# Patient Record
Sex: Male | Born: 1965
Health system: Southern US, Community
[De-identification: ages and names within clinical notes are randomized; demographics above are authoritative.]

## PROBLEM LIST (undated history)

## (undated) ENCOUNTER — Ambulatory Visit: Disposition: A | Discharge status: Home / Self Care | Payer: BC Managed Care – PPO

## (undated) DIAGNOSIS — N200 Calculus of kidney: Secondary | ICD-10-CM

---

## 2006-07-29 HISTORY — PX: OTHER SURGICAL HISTORY: SHX169

## 2012-01-08 ENCOUNTER — Emergency Department (HOSPITAL_COMMUNITY)
Admission: EM | Admit: 2012-01-08 | Discharge: 2012-01-08 | Disposition: A | Discharge status: Home / Self Care | Payer: 59 | Attending: Emergency Medicine | Admitting: Emergency Medicine

## 2012-01-08 ENCOUNTER — Encounter (HOSPITAL_COMMUNITY): Payer: Self-pay | Admitting: Neurology

## 2012-01-08 ENCOUNTER — Emergency Department (HOSPITAL_COMMUNITY): Payer: 59

## 2012-01-08 DIAGNOSIS — N200 Calculus of kidney: Secondary | ICD-10-CM

## 2012-01-08 DIAGNOSIS — R109 Unspecified abdominal pain: Secondary | ICD-10-CM | POA: Insufficient documentation

## 2012-01-08 DIAGNOSIS — R6883 Chills (without fever): Secondary | ICD-10-CM | POA: Insufficient documentation

## 2012-01-08 DIAGNOSIS — N201 Calculus of ureter: Secondary | ICD-10-CM | POA: Insufficient documentation

## 2012-01-08 DIAGNOSIS — R10813 Right lower quadrant abdominal tenderness: Secondary | ICD-10-CM | POA: Insufficient documentation

## 2012-01-08 LAB — DIFFERENTIAL
Basophils Relative: 0 % (ref 0–1)
Lymphs Abs: 1.4 10*3/uL (ref 0.7–4.0)
Monocytes Absolute: 0.5 10*3/uL (ref 0.1–1.0)
Monocytes Relative: 6 % (ref 3–12)
Neutro Abs: 6.3 10*3/uL (ref 1.7–7.7)
Neutrophils Relative %: 75 % (ref 43–77)

## 2012-01-08 LAB — COMPREHENSIVE METABOLIC PANEL
Alkaline Phosphatase: 57 U/L (ref 39–117)
BUN: 24 mg/dL — ABNORMAL HIGH (ref 6–23)
CO2: 24 mEq/L (ref 19–32)
Chloride: 103 mEq/L (ref 96–112)
Creatinine, Ser: 1.38 mg/dL — ABNORMAL HIGH (ref 0.50–1.35)
GFR calc Af Amer: 71 mL/min — ABNORMAL LOW (ref >90)
GFR calc non Af Amer: 61 mL/min — ABNORMAL LOW (ref >90)
Glucose, Bld: 130 mg/dL — ABNORMAL HIGH (ref 70–99)
Potassium: 3.8 mEq/L (ref 3.5–5.1)
Total Bilirubin: 1.9 mg/dL — ABNORMAL HIGH (ref 0.3–1.2)

## 2012-01-08 LAB — CBC
HCT: 42.4 % (ref 39.0–52.0)
Hemoglobin: 15.4 g/dL (ref 13.0–17.0)
MCHC: 36.3 g/dL — ABNORMAL HIGH (ref 30.0–36.0)
RBC: 4.94 MIL/uL (ref 4.22–5.81)

## 2012-01-08 LAB — LIPASE, BLOOD: Lipase: 27 U/L (ref 11–59)

## 2012-01-08 MED ORDER — IOHEXOL 300 MG/ML  SOLN
20.0000 mL | INTRAMUSCULAR | Status: AC
  Administered 2012-01-08: 20 mL via ORAL

## 2012-01-08 MED ORDER — ONDANSETRON HCL 4 MG/2ML IJ SOLN
4.0000 mg | Freq: Once | INTRAMUSCULAR | Status: AC
  Administered 2012-01-08: 4 mg via INTRAVENOUS
  Filled 2012-01-08: qty 2

## 2012-01-08 MED ORDER — TAMSULOSIN HCL 0.4 MG PO CAPS: 0.4000 mg | ORAL_CAPSULE | Freq: Every day | ORAL | Status: DC

## 2012-01-08 MED ORDER — ONDANSETRON HCL 4 MG PO TABS: 4.0000 mg | ORAL_TABLET | Freq: Four times a day (QID) | ORAL | Status: AC

## 2012-01-08 MED ORDER — KETOROLAC TROMETHAMINE 30 MG/ML IJ SOLN
30.0000 mg | Freq: Once | INTRAMUSCULAR | Status: AC
  Administered 2012-01-08: 30 mg via INTRAVENOUS
  Filled 2012-01-08: qty 1

## 2012-01-08 MED ORDER — HYDROMORPHONE HCL PF 1 MG/ML IJ SOLN
1.0000 mg | Freq: Once | INTRAMUSCULAR | Status: AC
  Administered 2012-01-08: 1 mg via INTRAVENOUS
  Filled 2012-01-08: qty 1

## 2012-01-08 MED ORDER — OXYCODONE-ACETAMINOPHEN 5-325 MG PO TABS: ORAL_TABLET | ORAL | Status: DC

## 2012-01-08 MED ORDER — SODIUM CHLORIDE 0.9 % IV SOLN
Freq: Once | INTRAVENOUS | Status: AC
  Administered 2012-01-08: 09:00:00 via INTRAVENOUS

## 2012-01-08 NOTE — ED Provider Notes (Cosign Needed)
History     CSN: [id number]  Arrival date & time 01/08/12  0744   First MD Initiated Contact with Patient 01/08/12 0753      Chief Complaint  Patient presents with  . Abdominal Pain    (Consider location/radiation/quality/duration/timing/severity/associated sxs/prior treatment) Patient is a 46 y.o. male presenting with abdominal pain. The history is provided by the patient (the pt complains of right lower quadrant pain today.  the pain is severe and began quickly). No language interpreter was used.  Abdominal Pain The primary symptoms of the illness include abdominal pain. The primary symptoms of the illness do not include fatigue or diarrhea. The current episode started 3 to 5 hours ago. The onset of the illness was sudden. The problem has not changed since onset. The illness is associated with awakening from sleep. The patient states that she believes she is currently not pregnant. The patient has not had a change in bowel habit. Additional symptoms associated with the illness include chills. Symptoms associated with the illness do not include hematuria, frequency or back pain. Significant associated medical issues do not include PUD.    History reviewed. No pertinent past medical history.  History reviewed. No pertinent past surgical history.  No family history on file.  History  Substance Use Topics  . Smoking status: Not on file  . Smokeless tobacco: Not on file  . Alcohol Use: Not on file      Review of Systems  Constitutional: Positive for chills. Negative for fatigue.  HENT: Negative for congestion, sinus pressure and ear discharge.   Eyes: Negative for discharge.  Respiratory: Negative for cough.   Cardiovascular: Negative for chest pain.  Gastrointestinal: Positive for abdominal pain. Negative for diarrhea.  Genitourinary: Negative for frequency and hematuria.  Musculoskeletal: Negative for back pain.  Skin: Negative for rash.  Neurological: Negative for  seizures and headaches.  Hematological: Negative.   Psychiatric/Behavioral: Negative for hallucinations.    Allergies  Review of patient's allergies indicates no known allergies.  Home Medications   Current Outpatient Rx  Name Route Sig Dispense Refill  . ONDANSETRON HCL 4 MG PO TABS Oral Take 1 tablet (4 mg total) by mouth every 6 (six) hours. 12 tablet 0  . OXYCODONE-ACETAMINOPHEN 5-325 MG PO TABS  Take one tablet every 4-6 hours for pain 25 tablet 0  . TAMSULOSIN HCL 0.4 MG PO CAPS Oral Take 1 capsule (0.4 mg total) by mouth daily. 5 capsule 0    BP 137/81  Pulse 67  Temp 97 F (36.1 C) (Oral)  Resp 17  SpO2 100%  Physical Exam  Constitutional: He is oriented to person, place, and time. He appears well-developed.  HENT:  Head: Normocephalic and atraumatic.  Eyes: Conjunctivae and EOM are normal. No scleral icterus.  Neck: Neck supple. No thyromegaly present.  Cardiovascular: Normal rate and regular rhythm.  Exam reveals no gallop and no friction rub.   No murmur heard. Pulmonary/Chest: No stridor. He has no wheezes. He has no rales. He exhibits no tenderness.  Abdominal: He exhibits no distension. There is tenderness. There is no rebound.       Tender rlq  Musculoskeletal: Normal range of motion. He exhibits no edema.  Lymphadenopathy:    He has no cervical adenopathy.  Neurological: He is oriented to person, place, and time. Coordination normal.  Skin: No rash noted. No erythema.  Psychiatric: He has a normal mood and affect. His behavior is normal.    ED Course  Procedures (including critical  care time)  Labs Reviewed  CBC - Abnormal; Notable for the following:    MCHC 36.3 (*)     All other components within normal limits  COMPREHENSIVE METABOLIC PANEL - Abnormal; Notable for the following:    Glucose, Bld 130 (*)     BUN 24 (*)     Creatinine, Ser 1.38 (*)     Total Bilirubin 1.9 (*)     GFR calc non Af Amer 61 (*)     GFR calc Af Amer 71 (*)     All  other components within normal limits  DIFFERENTIAL  LIPASE, BLOOD  URINALYSIS, ROUTINE W REFLEX MICROSCOPIC   Ct Abdomen Pelvis Wo Contrast  01/08/2012  *RADIOLOGY REPORT*  Clinical Data: Abdominal pain.  Nausea vomiting.  CT ABDOMEN AND PELVIS WITHOUT CONTRAST  Technique:  Multidetector CT imaging of the abdomen and pelvis was performed following the standard protocol without intravenous contrast.  Comparison: None.  Findings: The liver is diffusely fatty infiltrated and measures 18 cm in cranial caudal length.  Spleen is unremarkable.  The stomach, duodenum, pancreas, gallbladder, and adrenal glands are unremarkable.  Left kidney is normal in appearance.  There is mild fullness of the right intrarenal collecting system and perinephric edema/fluid is evident.  Although the right ureter is not substantially dilated, a 1 x 1 x 3 mm stone is identified in the distal right ureter about a centimeter proximal to the UVJ.  No evidence for left ureteral or bladder stones.  No abdominal aortic aneurysm.  There is no free fluid or lymphadenopathy in the abdomen.  Imaging through the pelvis shows no free intraperitoneal fluid.  No pelvic sidewall lymphadenopathy.  The terminal ileum is normal. The appendix is normal.  Bone windows reveal no worrisome lytic or sclerotic osseous lesions.  IMPRESSION: 1 x 1 x 3 mm distal right ureteral stone with prominent right perinephric fluid/edema evident.  Calyceal rupture is a possibility.  Original Report Authenticated By: ERIC A. MANSELL, M.D.     1. Kidney stone       MDM          Benny Lennert, MD 01/08/12 1302

## 2012-01-08 NOTE — ED Notes (Signed)
Patient sitting on side of bed trying to urinate.

## 2012-01-08 NOTE — ED Notes (Signed)
Pt 02 sats decreased to 85% on RA after dilaudid administration. Pt placed on o2 at 2l and sats increased to 100%. Remains a&ox4. edp aware

## 2012-01-08 NOTE — Discharge Instructions (Signed)
Strain your urine and follow up with DR. Borden next week.

## 2012-01-08 NOTE — ED Notes (Signed)
Patient resting and states he is still having pain in his right side. Patient denies nausea at this time. Family at bedside.

## 2012-01-08 NOTE — ED Notes (Signed)
Per ems- Pt comes from work c/o abd pain "burning" since 5 am this morning. Pt ate breakfast with no change, got to work had n/v x 3 reported. Abdm pain tenderness in RLQ no rebound tenderness. Positional movement causes pain, pain is constant. 142/90, RR 14, HR 72 SR. A x 4.

## 2012-01-08 NOTE — ED Notes (Signed)
Patient returned from CT. Family at bedside

## 2012-01-08 NOTE — ED Notes (Signed)
Patient transported to CT 

## 2012-01-13 ENCOUNTER — Other Ambulatory Visit: Payer: Self-pay | Admitting: Urology

## 2012-01-13 ENCOUNTER — Encounter (HOSPITAL_BASED_OUTPATIENT_CLINIC_OR_DEPARTMENT_OTHER): Payer: Self-pay | Admitting: *Deleted

## 2012-01-13 DIAGNOSIS — N200 Calculus of kidney: Secondary | ICD-10-CM

## 2012-01-13 HISTORY — DX: Calculus of kidney: N20.0

## 2012-01-13 NOTE — H&P (Signed)
Problems Problems  1. Distal Ureteral Stone On The Right 592.1  History of Present Illness     Ernest Bradshaw is a 46 yo WM who is sent in consultation from the ER for a right distal stone.  He had the onset of pain last week.  The pain was severe and was associated with some nausea.  He has had some penile pain but no hematuria or voiding complaints.  The stone was 1x1x30mm just above the UVJ on the right.  He had a similar episode about 3 months ago but it was transient.  He saw nothing pass.  He is still having some pain in the RLQ with a burning sensation.  He has had no other GU history.  He didn't have a UA at the hospital and wasn't able to get a specimen today.  His Cr. was 1.38 at the hospital.   Past Medical History Problems  1. History of  Esophageal Reflux 530.81 2. History of  Nephrolithiasis V13.01  Surgical History Problems  1. History of  Arm Incision Left  Current Meds 1. OxyCODONE HCl TABS; Therapy: (Recorded:17Jun2013) to  Allergies Medication  1. No Known Drug Allergies  Family History  Negative   Social History Problems    Caffeine Use 1 tea per day   Marital History - Single   Never A Smoker   Occupation: Firefighter    History of  Alcohol Use  Review of Systems  Genitourinary: urinary frequency, dysuria, nocturia, difficulty starting the urinary stream, weak urinary stream, penile pain and initiating urination requires straining (which has been present prior to the pain).  Gastrointestinal: nausea and abdominal pain.  Constitutional: night sweats and feeling tired (fatigue).  Respiratory: shortness of breath and cough.    Vitals Vital Signs [Data Includes: Last 1 Day]  17Jun2013 12:35PM  BMI Calculated: 25.67 BSA Calculated: 2.17 Height: 6 ft 2 in Weight: 200 lb  Blood Pressure: 153 / 90 Temperature: 98.8 F Heart Rate: 62  Physical Exam Constitutional: Well nourished and well developed . No acute distress.  ENT:. The ears and  nose are normal in appearance.  Neck: The appearance of the neck is normal and no neck mass is present.  Pulmonary: No respiratory distress and normal respiratory rhythm and effort.  Cardiovascular: Heart rate and rhythm are normal . No peripheral edema.  Abdomen: No masses are palpated. Moderate tenderness in the RLQ is present. mild right CVA tenderness, but no left CVA tenderness. No hernias are palpable. No hepatosplenomegaly noted.  Lymphatics: The supraclavicular nodes are not enlarged or tender.  Skin: Normal skin turgor, no visible rash and no visible skin lesions.  Neuro/Psych:. Mood and affect are appropriate.    Results/Data  The following images/tracing/specimen were independently visualized:  I have reviewed his CT films and report. KUB today shows retained GI contrast but there is a 2.40mm calcification in the right pelvis consistent with the stone seen on CT.    Assessment Assessed  1. Distal Ureteral Stone On The Right 592.1   He has a right distal stone with persistent symptoms.   Plan Distal Ureteral Stone On The Right (592.1)  1. Oxycodone-Acetaminophen 5-325 MG Oral Tablet; take 1 or 2 tablets q 4-6 hours prn pain;  Therapy: 17Jun2013 to (Last Rx:17Jun2013) 2. KUB  Done: 17Jun2013 12:00AM 3. Follow-up Schedule Surgery Office  Follow-up  Requested for: 17Jun2013 Health Maintenance (V70.0)  4. UA With REFLEX  Done: 17Jun2013   I discussed further medical therapy vs ureteroscopy.  I don't think we could do this with [facility]SWL. He wants to get something done.  I will set him up for right ureteroscopy and have reviewed the risks in detail including but not limited to, bleeding, infection, ureteral injury, need for stent or additional procedures, thrombotic events and anesthetic complications. I have refilled his pain med.

## 2012-01-13 NOTE — Progress Notes (Signed)
To [facility] at 1130.Kub,Hg on arrival. Npo after MN-may have clear liquids only until 0700,then Npo-take pain meds as needed w/clear liquids.

## 2012-01-14 ENCOUNTER — Ambulatory Visit (HOSPITAL_COMMUNITY): Payer: 59

## 2012-01-14 ENCOUNTER — Encounter (HOSPITAL_BASED_OUTPATIENT_CLINIC_OR_DEPARTMENT_OTHER): Payer: Self-pay | Admitting: *Deleted

## 2012-01-14 ENCOUNTER — Encounter (HOSPITAL_BASED_OUTPATIENT_CLINIC_OR_DEPARTMENT_OTHER): Payer: Self-pay | Admitting: Anesthesiology

## 2012-01-14 ENCOUNTER — Ambulatory Visit (HOSPITAL_BASED_OUTPATIENT_CLINIC_OR_DEPARTMENT_OTHER): Payer: 59 | Admitting: Anesthesiology

## 2012-01-14 ENCOUNTER — Encounter (HOSPITAL_BASED_OUTPATIENT_CLINIC_OR_DEPARTMENT_OTHER)
Admission: RE | Disposition: A | Discharge status: Home / Self Care | Payer: Self-pay | Source: Ambulatory Visit | Attending: Urology

## 2012-01-14 ENCOUNTER — Ambulatory Visit (HOSPITAL_BASED_OUTPATIENT_CLINIC_OR_DEPARTMENT_OTHER)
Admission: RE | Admit: 2012-01-14 | Discharge: 2012-01-14 | Disposition: A | Discharge status: Home / Self Care | Payer: 59 | Source: Ambulatory Visit | Attending: Urology | Admitting: Urology

## 2012-01-14 DIAGNOSIS — Z79899 Other long term (current) drug therapy: Secondary | ICD-10-CM | POA: Insufficient documentation

## 2012-01-14 DIAGNOSIS — K219 Gastro-esophageal reflux disease without esophagitis: Secondary | ICD-10-CM | POA: Insufficient documentation

## 2012-01-14 DIAGNOSIS — N201 Calculus of ureter: Secondary | ICD-10-CM | POA: Insufficient documentation

## 2012-01-14 HISTORY — PX: CYSTOSCOPY WITH URETEROSCOPY: SHX5123

## 2012-01-14 HISTORY — DX: Calculus of kidney: N20.0

## 2012-01-14 SURGERY — CYSTOSCOPY WITH URETEROSCOPY
Anesthesia: General | Site: Ureter | Laterality: Right | Wound class: Clean Contaminated

## 2012-01-14 MED ORDER — FENTANYL CITRATE 0.05 MG/ML IJ SOLN
INTRAMUSCULAR | Status: DC | PRN
  Administered 2012-01-14: 25 ug via INTRAVENOUS
  Administered 2012-01-14: 50 ug via INTRAVENOUS
  Administered 2012-01-14 (×3): 25 ug via INTRAVENOUS

## 2012-01-14 MED ORDER — SODIUM CHLORIDE 0.9 % IR SOLN
Status: DC | PRN
  Administered 2012-01-14: 6000 mL

## 2012-01-14 MED ORDER — ONDANSETRON HCL 4 MG/2ML IJ SOLN: 4.0000 mg | Freq: Four times a day (QID) | INTRAMUSCULAR | Status: DC | PRN

## 2012-01-14 MED ORDER — BELLADONNA ALKALOIDS-OPIUM 16.2-60 MG RE SUPP
RECTAL | Status: DC | PRN
  Administered 2012-01-14: 1 via RECTAL

## 2012-01-14 MED ORDER — PROPOFOL 10 MG/ML IV EMUL
INTRAVENOUS | Status: DC | PRN
  Administered 2012-01-14: 230 mg via INTRAVENOUS
  Administered 2012-01-14: 50 mg via INTRAVENOUS

## 2012-01-14 MED ORDER — ACETAMINOPHEN 325 MG PO TABS: 650.0000 mg | ORAL_TABLET | ORAL | Status: DC | PRN

## 2012-01-14 MED ORDER — SODIUM CHLORIDE 0.9 % IJ SOLN: 3.0000 mL | Freq: Two times a day (BID) | INTRAMUSCULAR | Status: DC

## 2012-01-14 MED ORDER — PHENAZOPYRIDINE HCL 200 MG PO TABS
200.0000 mg | ORAL_TABLET | Freq: Three times a day (TID) | ORAL | Status: AC
  Administered 2012-01-14: 200 mg via ORAL

## 2012-01-14 MED ORDER — CIPROFLOXACIN IN D5W 400 MG/200ML IV SOLN
400.0000 mg | INTRAVENOUS | Status: AC
  Administered 2012-01-14: 400 mg via INTRAVENOUS

## 2012-01-14 MED ORDER — HYOSCYAMINE SULFATE 0.125 MG SL SUBL: 0.1250 mg | SUBLINGUAL_TABLET | SUBLINGUAL | Status: DC | PRN

## 2012-01-14 MED ORDER — OXYCODONE HCL 5 MG PO TABS: 5.0000 mg | ORAL_TABLET | ORAL | Status: DC | PRN

## 2012-01-14 MED ORDER — FENTANYL CITRATE 0.05 MG/ML IJ SOLN: 25.0000 ug | INTRAMUSCULAR | Status: DC | PRN

## 2012-01-14 MED ORDER — LACTATED RINGERS IV SOLN
INTRAVENOUS | Status: DC
  Administered 2012-01-14 (×3): via INTRAVENOUS

## 2012-01-14 MED ORDER — SODIUM CHLORIDE 0.9 % IJ SOLN: 3.0000 mL | INTRAMUSCULAR | Status: DC | PRN

## 2012-01-14 MED ORDER — PHENAZOPYRIDINE HCL 200 MG PO TABS: 200.0000 mg | ORAL_TABLET | Freq: Three times a day (TID) | ORAL | Status: AC | PRN

## 2012-01-14 MED ORDER — ACETAMINOPHEN 650 MG RE SUPP: 650.0000 mg | RECTAL | Status: DC | PRN

## 2012-01-14 MED ORDER — LIDOCAINE HCL 2 % EX GEL
CUTANEOUS | Status: DC | PRN
  Administered 2012-01-14: 1 via URETHRAL

## 2012-01-14 MED ORDER — LIDOCAINE HCL (CARDIAC) 20 MG/ML IV SOLN
INTRAVENOUS | Status: DC | PRN
  Administered 2012-01-14: 80 mg via INTRAVENOUS

## 2012-01-14 MED ORDER — SODIUM CHLORIDE 0.9 % IV SOLN: 250.0000 mL | INTRAVENOUS | Status: DC | PRN

## 2012-01-14 MED ORDER — IOHEXOL 350 MG/ML SOLN
INTRAVENOUS | Status: DC | PRN
  Administered 2012-01-14: 15 mL

## 2012-01-14 SURGICAL SUPPLY — 35 items
BAG DRAIN URO-CYSTO SKYTR STRL (DRAIN) ×2 IMPLANT
BASKET LASER NITINOL 1.9FR (BASKET) IMPLANT
BASKET SEGURA 3FR (UROLOGICAL SUPPLIES) IMPLANT
BASKET STNLS GEMINI 4WIRE 3FR (BASKET) IMPLANT
BASKET ZERO TIP NITINOL 2.4FR (BASKET) ×2 IMPLANT
BRUSH URET BIOPSY 3F (UROLOGICAL SUPPLIES) IMPLANT
CANISTER SUCT LVC 12 LTR MEDI- (MISCELLANEOUS) ×2 IMPLANT
CATH URET 5FR 28IN CONE TIP (BALLOONS)
CATH URET 5FR 28IN OPEN ENDED (CATHETERS) IMPLANT
CATH URET 5FR 70CM CONE TIP (BALLOONS) IMPLANT
CLOTH BEACON ORANGE TIMEOUT ST (SAFETY) ×2 IMPLANT
DRAPE CAMERA CLOSED 9X96 (DRAPES) ×2 IMPLANT
ELECT REM PT RETURN 9FT ADLT (ELECTROSURGICAL)
ELECTRODE REM PT RTRN 9FT ADLT (ELECTROSURGICAL) IMPLANT
GLOVE BIO SURGEON STRL SZ 6.5 (GLOVE) ×2 IMPLANT
GLOVE BIO SURGEON STRL SZ7 (GLOVE) ×2 IMPLANT
GLOVE BIOGEL PI IND STRL 7.5 (GLOVE) ×1 IMPLANT
GLOVE BIOGEL PI INDICATOR 7.5 (GLOVE) ×1
GLOVE SURG SS PI 8.0 STRL IVOR (GLOVE) ×2 IMPLANT
GOWN STRL REIN XL XLG (GOWN DISPOSABLE) ×2 IMPLANT
GOWN XL W/COTTON TOWEL STD (GOWNS) ×2 IMPLANT
GUIDEWIRE 0.038 PTFE COATED (WIRE) IMPLANT
GUIDEWIRE ANG ZIPWIRE 038X150 (WIRE) IMPLANT
GUIDEWIRE STR DUAL SENSOR (WIRE) ×2 IMPLANT
IV NS IRRIG 3000ML ARTHROMATIC (IV SOLUTION) ×4 IMPLANT
KIT BALLIN UROMAX 15FX10 (LABEL) IMPLANT
KIT BALLN UROMAX 15FX4 (MISCELLANEOUS) ×1 IMPLANT
KIT BALLN UROMAX 26 75X4 (MISCELLANEOUS) ×1
LASER FIBER DISP (UROLOGICAL SUPPLIES) IMPLANT
LASER FIBER DISP 1000U (UROLOGICAL SUPPLIES) IMPLANT
PACK CYSTOSCOPY (CUSTOM PROCEDURE TRAY) ×2 IMPLANT
SET HIGH PRES BAL DIL (LABEL)
SHEATH URET ACCESS 12FR/35CM (UROLOGICAL SUPPLIES) IMPLANT
SHEATH URET ACCESS 12FR/55CM (UROLOGICAL SUPPLIES) IMPLANT
STENT URET 6FRX26 CONTOUR (STENTS) ×2 IMPLANT

## 2012-01-14 NOTE — Anesthesia Procedure Notes (Signed)
Procedure Name: LMA Insertion Date/Time: 01/14/2012 1:01 PM Performed by: Fran Lowes Pre-anesthesia Checklist: Patient identified, Emergency Drugs available, Suction available and Patient being monitored Patient Re-evaluated:Patient Re-evaluated prior to inductionOxygen Delivery Method: Circle System Utilized Preoxygenation: Pre-oxygenation with 100% oxygen Intubation Type: IV induction Ventilation: Mask ventilation without difficulty LMA: LMA inserted LMA Size: 4.0 Number of attempts: 1 Airway Equipment and Method: bite block Placement Confirmation: positive ETCO2 Tube secured with: Tape Dental Injury: Teeth and Oropharynx as per pre-operative assessment  Comments: LMA inserted by Dr. Shireen Quan.

## 2012-01-14 NOTE — Transfer of Care (Signed)
Immediate Anesthesia Transfer of Care Note  Patient: Ernest Bradshaw  Procedure(s) Performed: Procedure(s) (LRB): CYSTOSCOPY WITH URETEROSCOPY (Right)  Patient Location: Patient transported to PACU with oxygen via face mask at 4 Liters / Min  Anesthesia Type: General  Level of Consciousness: awake and alert   Airway & Oxygen Therapy: Patient Spontanous Breathing and Patient connected to face mask oxygen  Post-op Assessment: Report given to PACU RN and Post -op Vital signs reviewed and stable  Post vital signs: Reviewed and stable  Dentition: Teeth and oropharynx remain in pre-op condition  Complications: No apparent anesthesia complications

## 2012-01-14 NOTE — Interval H&P Note (Signed)
History and Physical Interval Note:  01/14/2012 11:45 AM  Ernest Bradshaw  has presented today for surgery, with the diagnosis of right distal stone  The various methods of treatment have been discussed with the patient and family. After consideration of risks, benefits and other options for treatment, the patient has consented to  Procedure(s) (LRB): CYSTOSCOPY WITH URETEROSCOPY (Right) as a surgical intervention .  The patient's history has been reviewed, patient examined, no change in status, stable for surgery.  I have reviewed the patients' chart and labs.  Questions were answered to the patient's satisfaction.     Kingson Lohmeyer J

## 2012-01-14 NOTE — Brief Op Note (Signed)
01/14/2012  1:42 PM  PATIENT:  Ernest Bradshaw  46 y.o. male  PRE-OPERATIVE DIAGNOSIS:  right distal stone  POST-OPERATIVE DIAGNOSIS:  right distal stone  PROCEDURE:  Procedure(s) (LRB): CYSTOSCOPY WITH URETEROSCOPIC STONE EXTRACTION with STENT (Right)  SURGEON:  Surgeon(s) and Role:    * Anner Crete, MD - Primary  PHYSICIAN ASSISTANT:   ASSISTANTS: none   ANESTHESIA:   general  EBL:  Total I/O In: 1000 [I.V.:1000] Out: -   BLOOD ADMINISTERED:none  DRAINS: 6 x 57fr right JJ stent   LOCAL MEDICATIONS USED:  LIDOCAINE   SPECIMEN:  Source of Specimen:  stone from right ureter  DISPOSITION OF SPECIMEN:  to patient to bring to office  COUNTS:  YES  TOURNIQUET:  * No tourniquets in log *  DICTATION: .Other Dictation: Dictation Number [id number]  PLAN OF CARE: Discharge to home after PACU  PATIENT DISPOSITION:  PACU - hemodynamically stable.   Delay start of Pharmacological VTE agent (>24hrs) due to surgical blood loss or risk of bleeding: yes

## 2012-01-14 NOTE — Discharge Instructions (Addendum)
Ureteral Stent A ureteral stent is a soft plastic tube with multiple holes. The stent is inserted into a ureter to help drain urine from the kidney into the bladder. The tube has a coil on each end to keep it from falling out. One end stays in the kidney. The other end stays in the bladder. A stent cannot be seen from the outside. Usually it does not keep you from going about normal routines. A ureteral stent is used to bypass a blockage in your kidney or ureter. This blockage can be caused by kidney stones, scar tissue, pregnancy, or other causes. It can also be used during treatment to remove a kidney stone or to let a ureter heal after surgery. The stent allows urine to drain from the kidney into the bladder. It is most often taken out after the blockage has been removed or the ureter has healed. If a stent is needed for a long time, it will be changed every few months. INSERTING THE STENT Your stent is put in by a urologist. This is a medical doctor trained for treating genitourinary (kidney, ureter and bladder) problems. Before your stent is put in, your caregiver may order x-rays or other imaging tests of your kidneys and ureters. The stent is inserted in a hospital or same day surgical center. You can anticipate going home the same day. PROCEDURE  A special x-ray machine called a fluoroscope is used to guide the insertion of your stent. This allows your doctor to make sure the stent is in the correct place.   First you are given anesthesia to keep you comfortable.   Then your doctor inserts a special lighted instrument called a cystoscope into your bladder. This allows your doctor to see the opening to the ureter.   A thin wire is carefully threaded into the bladder and up the ureter. The stent is inserted over the wire and the wire is then removed.  HOME CARE INSTRUCTIONS   While the stent is in place, you may feel some discomfort. Certain movements may trigger pain or a feeling that you need  to urinate. Your caregiver may give you pain medication. Only take over-the-counter or prescription medicines for pain, discomfort, or fever as directed by your caregiver. Do not take aspirin as this can make bleeding worse.   You may be given medications to prevent infection or bladder spasms. Be sure to take all medications as directed.   Drink plenty of fluids.   You may have small amounts of bleeding causing your urine to be slightly red. This is nothing to be concerned about.  REMOVAL OF THE STENT Your stent is left in until the blockage is resolved. This may take two weeks or longer. Before the stent is removed, you may have an x-ray make sure the ureter is open. The stent can be removed by your caregiver in the office. Medications may be given for comfort. Be sure to keep all follow-up appointments so your caregiver can check that you are healing properly. SEEK IMMEDIATE MEDICAL CARE IF:   Your urine is dark red or has blood clots.   You are incontinent (leaking urine).   You have an oral temperature above 102 F (38.9 C), chills, nausea (feeling sick to your stomach), or vomiting.   Your pain is not relieved by pain medication. Do not take aspirin as this can make bleeding worse.   The end of the stent comes out of the urethra.  Document Released: 07/12/2000 Document Revised:   07/04/2011 Document Reviewed: 07/11/2008 ExitCare Patient Information 2012 [place], [facility]    CYSTOSCOPY HOME CARE INSTRUCTIONS  Activity: Rest for the remainder of the day.  Do not drive or operate equipment today.  You may resume normal activities in one to two days as instructed by your physician.   Meals: Drink plenty of liquids and eat light foods such as gelatin or soup this evening.  You may return to a normal meal plan tomorrow.  Return to Work: You may return to work in one to two days or as instructed by your physician.  Special Instructions / Symptoms: Call your physician if any of these  symptoms occur:   -persistent or heavy bleeding  -bleeding which continues after first few urination  -large blood clots that are difficult to pass  -urine stream diminishes or stops completely  -fever equal to or higher than 101 degrees Farenheit.  -cloudy urine with a strong, foul odor  -severe pain  Females should always wipe from front to back after elimination.  You may feel some burning pain when you urinate.  This should disappear with time.  Applying moist heat to the lower abdomen or a hot tub bath may help relieve the pain. \  Follow-Up / Date of Return Visit to Your Physician:  *** Call for an appointment to arrange follow-up.  Patient Signature:  ________________________________________________________  Nurse's Signature:  ________________________________________________________     Post Anesthesia Home Care Instructions  Activity: Get plenty of rest for the remainder of the day. A responsible adult should stay with you for 24 hours following the procedure.  For the next 24 hours, DO NOT: -Drive a car -Advertising copywriter -Drink alcoholic beverages -Take any medication unless instructed by your physician -Make any legal decisions or sign important papers.  Meals: Start with liquid foods such as gelatin or soup. Progress to regular foods as tolerated. Avoid greasy, spicy, heavy foods. If nausea and/or vomiting occur, drink only clear liquids until the nausea and/or vomiting subsides. Call your physician if vomiting continues.  Special Instructions/Symptoms: Your throat may feel dry or sore from the anesthesia or the breathing tube placed in your throat during surgery. If this causes discomfort, gargle with warm salt water. The discomfort should disappear within 24 hours.  Marland Kitchen

## 2012-01-14 NOTE — Anesthesia Preprocedure Evaluation (Signed)
Anesthesia Evaluation  Patient identified by MRN, date of birth, ID band Patient awake    Reviewed: Allergy & Precautions, H&P , NPO status , Patient's Chart, lab work & pertinent test results, reviewed documented beta blocker date and time   Airway Mallampati: II TM Distance: >3 FB Neck ROM: Full    Dental  (+) Teeth Intact and Dental Advisory Given   Pulmonary neg pulmonary ROS,  breath sounds clear to auscultation        Cardiovascular negative cardio ROS  Rhythm:Regular Rate:Normal  Denies cardiac symptoms   Neuro/Psych negative neurological ROS  negative psych ROS   GI/Hepatic negative GI ROS, Neg liver ROS,   Endo/Other  negative endocrine ROS  Renal/GU Kidney stone  negative genitourinary   Musculoskeletal negative musculoskeletal ROS (+)   Abdominal   Peds negative pediatric ROS (+)  Hematology negative hematology ROS (+)   Anesthesia Other Findings   Reproductive/Obstetrics negative OB ROS                           Anesthesia Physical Anesthesia Plan  ASA: I  Anesthesia Plan: General   Post-op Pain Management:    Induction: Intravenous  Airway Management Planned: LMA  Additional Equipment:   Intra-op Plan:   Post-operative Plan: Extubation in OR  Informed Consent: I have reviewed the patients History and Physical, chart, labs and discussed the procedure including the risks, benefits and alternatives for the proposed anesthesia with the patient or authorized representative who has indicated his/her understanding and acceptance.   Dental advisory given  Plan Discussed with: Surgeon and CRNA  Anesthesia Plan Comments:         Anesthesia Quick Evaluation

## 2012-01-15 ENCOUNTER — Encounter (HOSPITAL_BASED_OUTPATIENT_CLINIC_OR_DEPARTMENT_OTHER): Payer: Self-pay | Admitting: Urology

## 2012-01-15 NOTE — Anesthesia Postprocedure Evaluation (Signed)
  Anesthesia Post-op Note  Patient: Ernest Bradshaw  Procedure(s) Performed: Procedure(s) (LRB): CYSTOSCOPY WITH URETEROSCOPY (Right)  Patient Location: PACU  Anesthesia Type: General  Level of Consciousness: oriented and sedated  Airway and Oxygen Therapy: Patient Spontanous Breathing  Post-op Pain: mild  Post-op Assessment: Post-op Vital signs reviewed, Patient's Cardiovascular Status Stable, Respiratory Function Stable and Patent Airway  Post-op Vital Signs: stable  Complications: No apparent anesthesia complications

## 2012-01-15 NOTE — Op Note (Signed)
NAMEQUASHAUN, Ernest Bradshaw NO.:  [id number]  MEDICAL RECORD NO.:  [id number]  LOCATION:                               FACILITY:  [facility]  PHYSICIAN:  Excell Seltzer. Annabell Howells, M.D.    DATE OF BIRTH:  [birth year]  DATE OF PROCEDURE:  01/14/2012 DATE OF DISCHARGE:                              OPERATIVE REPORT   PROCEDURES:  Cystoscopy, right ureteroscopy with stone extraction, placement of right double-J stent.  PREOPERATIVE DIAGNOSIS:  Right distal ureteral stone.  POSTOPERATIVE DIAGNOSIS:  Right distal ureteral stone.  SURGEON:  Excell Seltzer. Annabell Howells, M.D.  ANESTHESIA:  General.  SPECIMEN:  Stone.  DRAINS:  A 6-French 26 cm double-J stent.  COMPLICATIONS:  None.  INDICATIONS:  Ernest Bradshaw is a 46 year old white male with a 1 x 3 mm right distal stone which has failed to progress.  He has elected ureteroscopy.  FINDINGS OF PROCEDURE:  He was given Cipro.  He was taken to the operating room where general anesthetic was induced.  He was placed in lithotomy position and fitted with PAS hose.  His perineum and genitalia were prepped with Betadine solution and he was draped in usual sterile fashion.  Cystoscopy was performed using a 22-French scope, and 12 and 70 degree lenses.  Examination revealed a normal urethra.  The external sphincter was intact.  The prostatic urethra was short with obstruction. Examination of bladder revealed mild trabeculation.  No tumors, stones, or inflammation were noted.  Ureteral orifices were unremarkable although delicate.  The right ureteral orifice was cannulated with a sensor guidewire, but the guidewire would not pass more than 2 cm up the ureter without buckling.  I then passed a 5-French open-end catheter and I was able to advance the wire beyond the area of the stone up to the kidney.  At this point, I elected to use a balloon dilation catheter.  This was placed over the wire.  Initially, I was only able to dilate the very distal ureter  with the 15-French 4 cm balloon.  The balloon was then replaced over the wire through the cystoscope to stiffen it and I was able to advance it by the stone and inflate once again to 14 atmospheres.  Once the waste disappeared, the balloon and cystoscope were removed leaving the wire in place, and a 6.4 ureteroscope was then passed alongside the wire.  The stone had been pushed into the mucosa, but was eventually identified and retrieved with a Nitinol basket.  Inspection revealed some mucosal injury from the balloon dilation and it was felt that stenting was indicated.  The cystoscope was reinserted over the wire and a 6-French 26 cm double- J stent without string was inserted over the wire to the kidney under fluoroscopic guidance.  The wire was removed leaving good coil in the kidney and good coil in the bladder.  The patient's bladder was drained. There was a little bit of prostatic bleeding I did not feel required attention.  The scope was removed.  His urethra was instilled with 10 cc of 2% lidocaine jelly.  He had received B and O suppository earlier.  He was taken down from lithotomy position.  His anesthetic  was reversed. He was moved to recovery room in stable condition.  There were no complications.     Excell Seltzer. Annabell Howells, M.D.     JJW/MEDQ  D:  01/14/2012  T:  01/15/2012  Job:  [id number]

## 2013-04-16 IMAGING — CT CT ABD-PELV W/O CM
2 of 4 series · 17 of 46 positions shown, 19 images · non-contrast
Comparison: None.

CLINICAL DATA: Abdominal pain.  Nausea vomiting.

CT ABDOMEN AND PELVIS WITHOUT CONTRAST
TECHNIQUE: Multidetector CT imaging of the abdomen and pelvis was
performed following the standard protocol without intravenous
contrast.

[Series 2: stone >200 lbs 5.0 b31f st · axial · 0.84mm/px · z∈[-404,+30]mm · 14 of 97 slices shown, 16 images]
[im 5/97  soft-tissue]
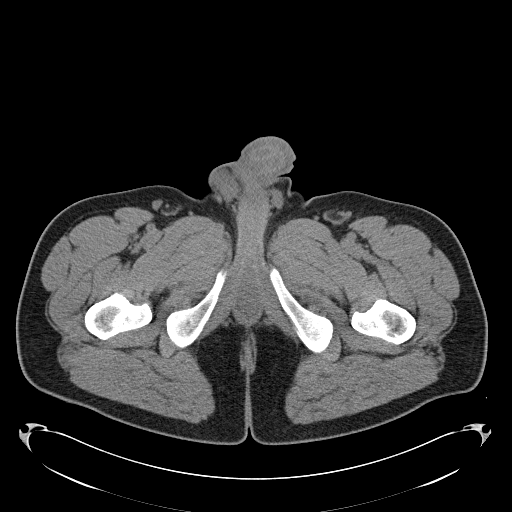
[im 5/97  bone]
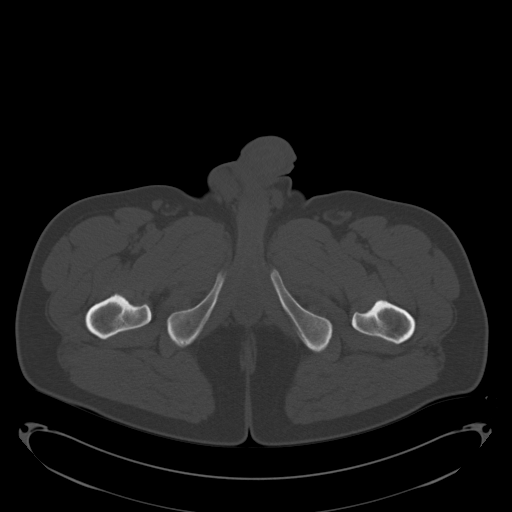
[im 13/97  soft-tissue]
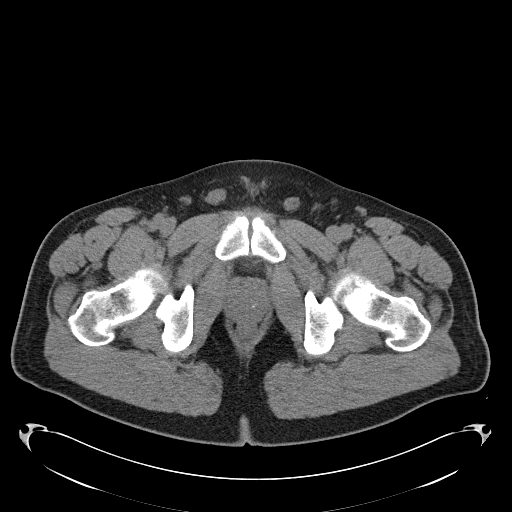
[im 17/97  soft-tissue]
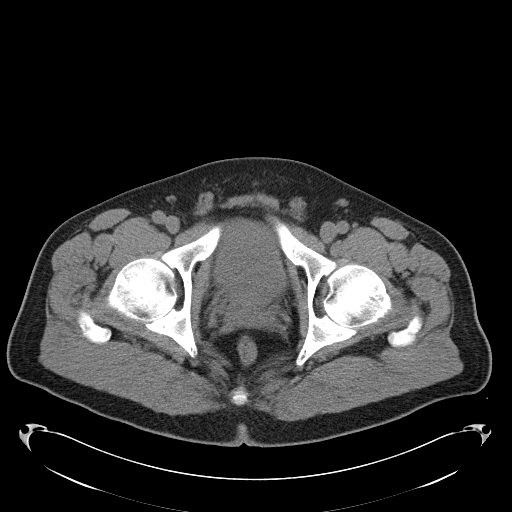
[im 26/97  soft-tissue]
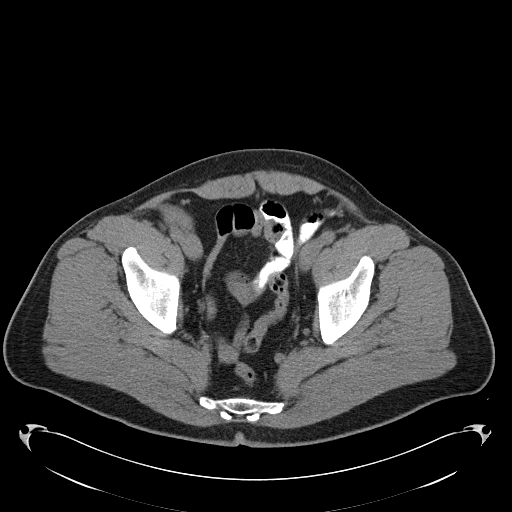
[im 34/97  soft-tissue]
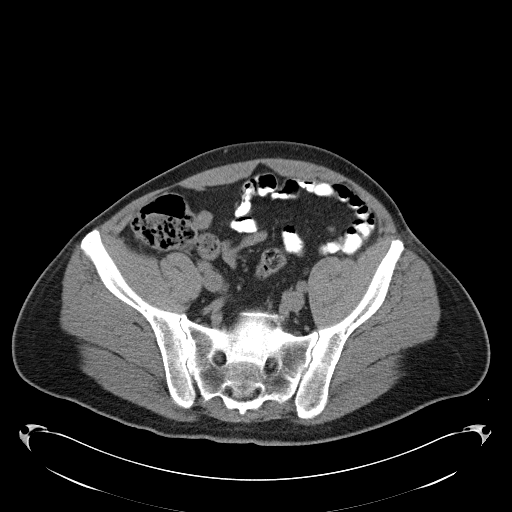
[im 38/97  soft-tissue]
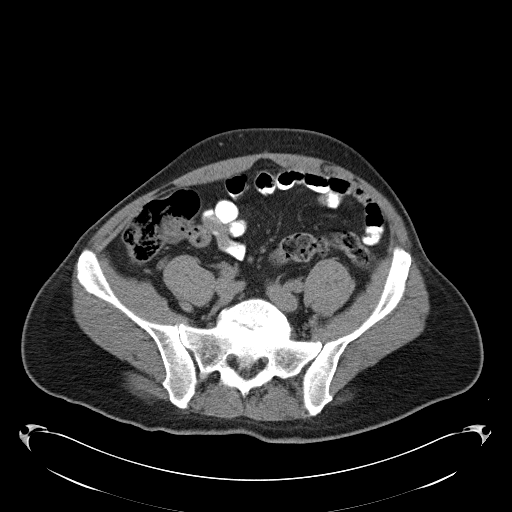
[im 46/97  soft-tissue]
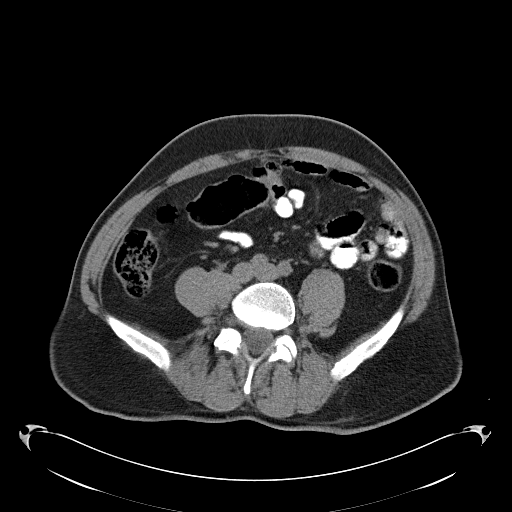
[im 51/97  soft-tissue]
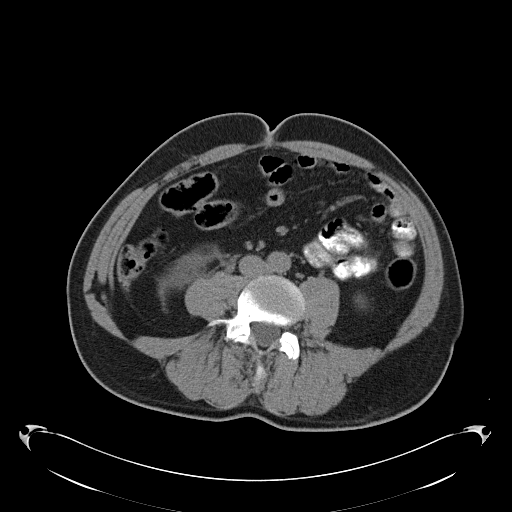
[im 59/97  soft-tissue]
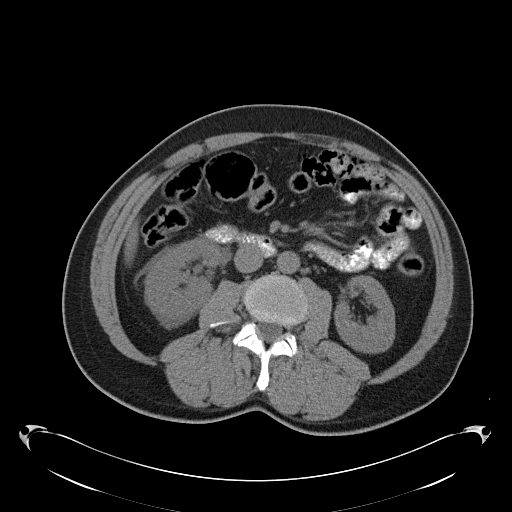
[im 59/97  bone]
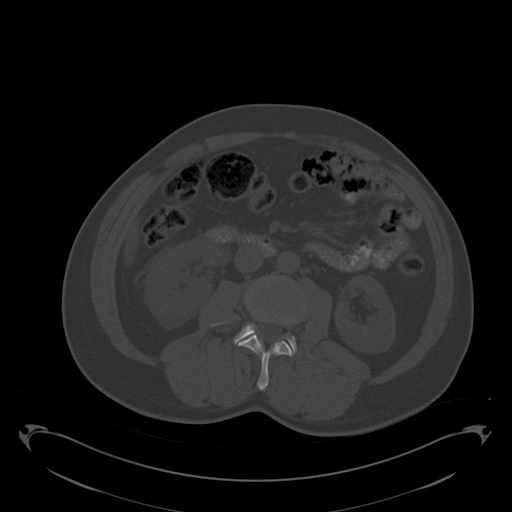
[im 63/97  soft-tissue]
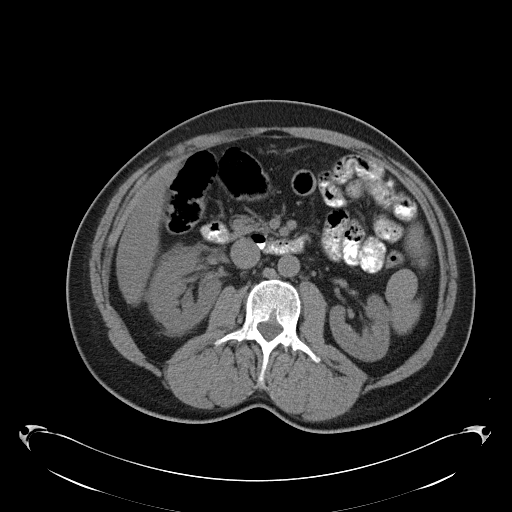
[im 71/97  soft-tissue]
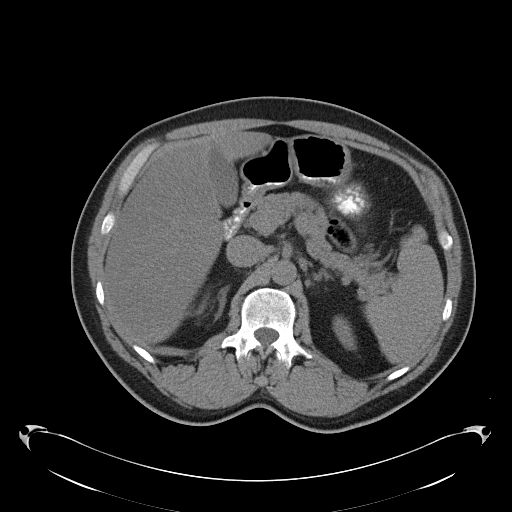
[im 80/97  soft-tissue]
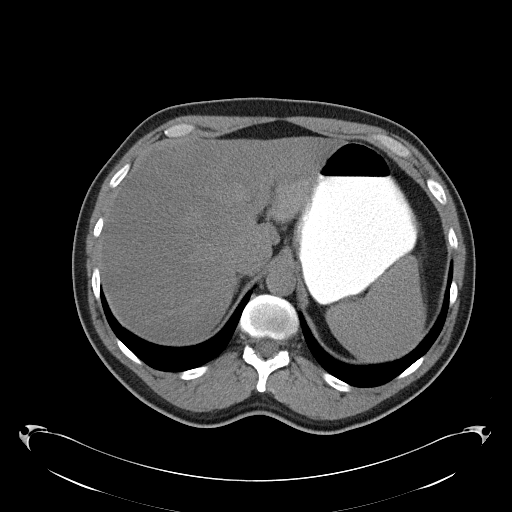
[im 84/97  soft-tissue]
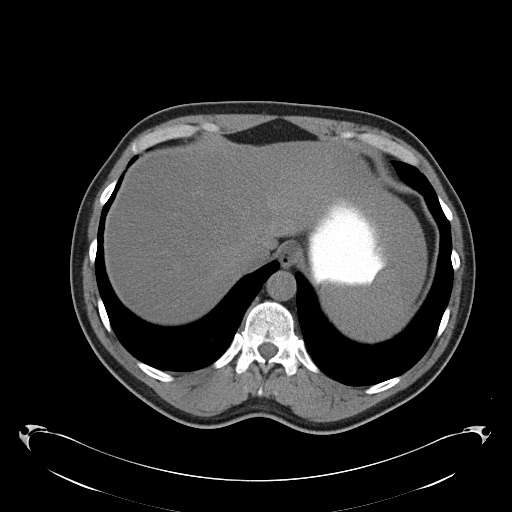
[im 92/97  soft-tissue]
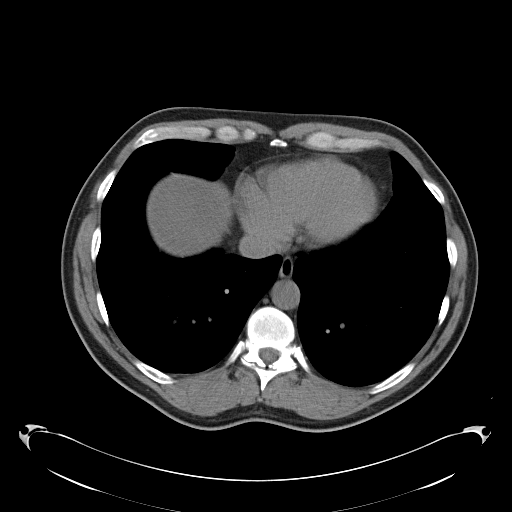

[Series 5: coronals cor · coronal · 0.95mm/px · 3 of 83 slices shown]
[im 28/83  soft-tissue]
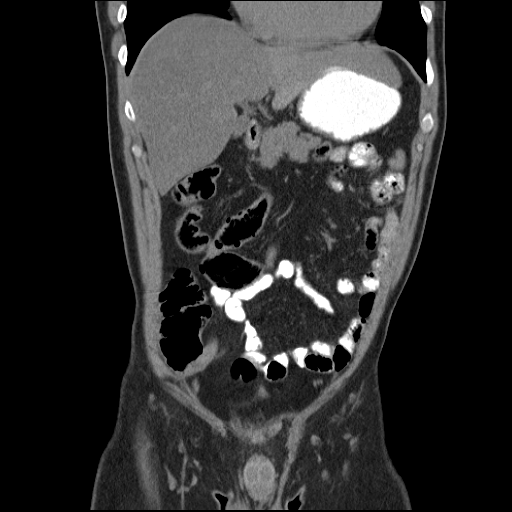
[im 37/83  soft-tissue]
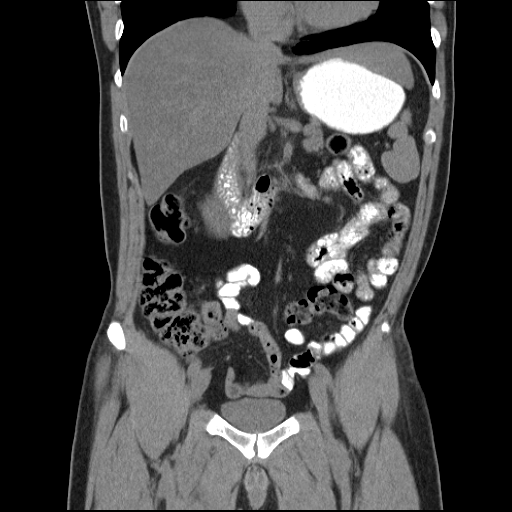
[im 46/83  soft-tissue]
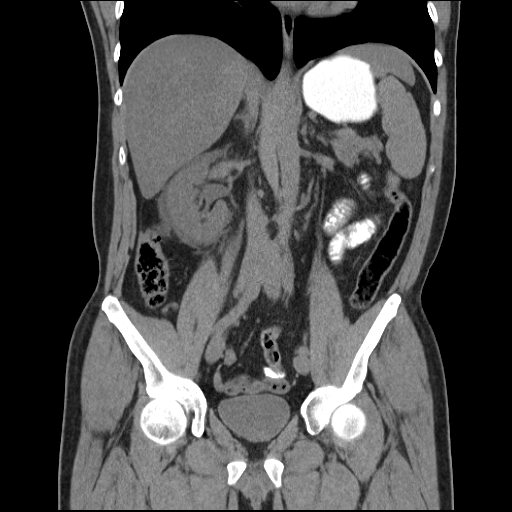

[17 of 46 positions shown; findings below may reference images not displayed]

FINDINGS: The liver is diffusely fatty infiltrated and measures 18
cm in cranial caudal length.  Spleen is unremarkable.  The stomach,
duodenum, pancreas, gallbladder, and adrenal glands are
unremarkable.  Left kidney is normal in appearance.  There is mild
fullness of the right intrarenal collecting system and perinephric
edema/fluid is evident.  Although the right ureter is not
substantially dilated, a 1 x 1 x 3 mm stone is identified in the
distal right ureter about a centimeter proximal to the UVJ.  No
evidence for left ureteral or bladder stones.

No abdominal aortic aneurysm.  There is no free fluid or
lymphadenopathy in the abdomen.

Imaging through the pelvis shows no free intraperitoneal fluid.  No
pelvic sidewall lymphadenopathy.  The terminal ileum is normal.
The appendix is normal.

Bone windows reveal no worrisome lytic or sclerotic osseous
lesions.
IMPRESSION: 1 x 1 x 3 mm distal right ureteral stone with prominent right
perinephric fluid/edema evident.  Calyceal rupture is a
possibility.

## 2013-04-22 IMAGING — CR DG ABDOMEN 1V
1 series · 1 of 1 positions shown · non-contrast
Comparison: KUB from [HOSPITAL] from  01/13/2012.
CT urogram 01/08/2012.

CLINICAL DATA: 45-year-old male for lithotripsy.  Distal right
stone.

ABDOMEN - 1 VIEW

[t abdomen supine]
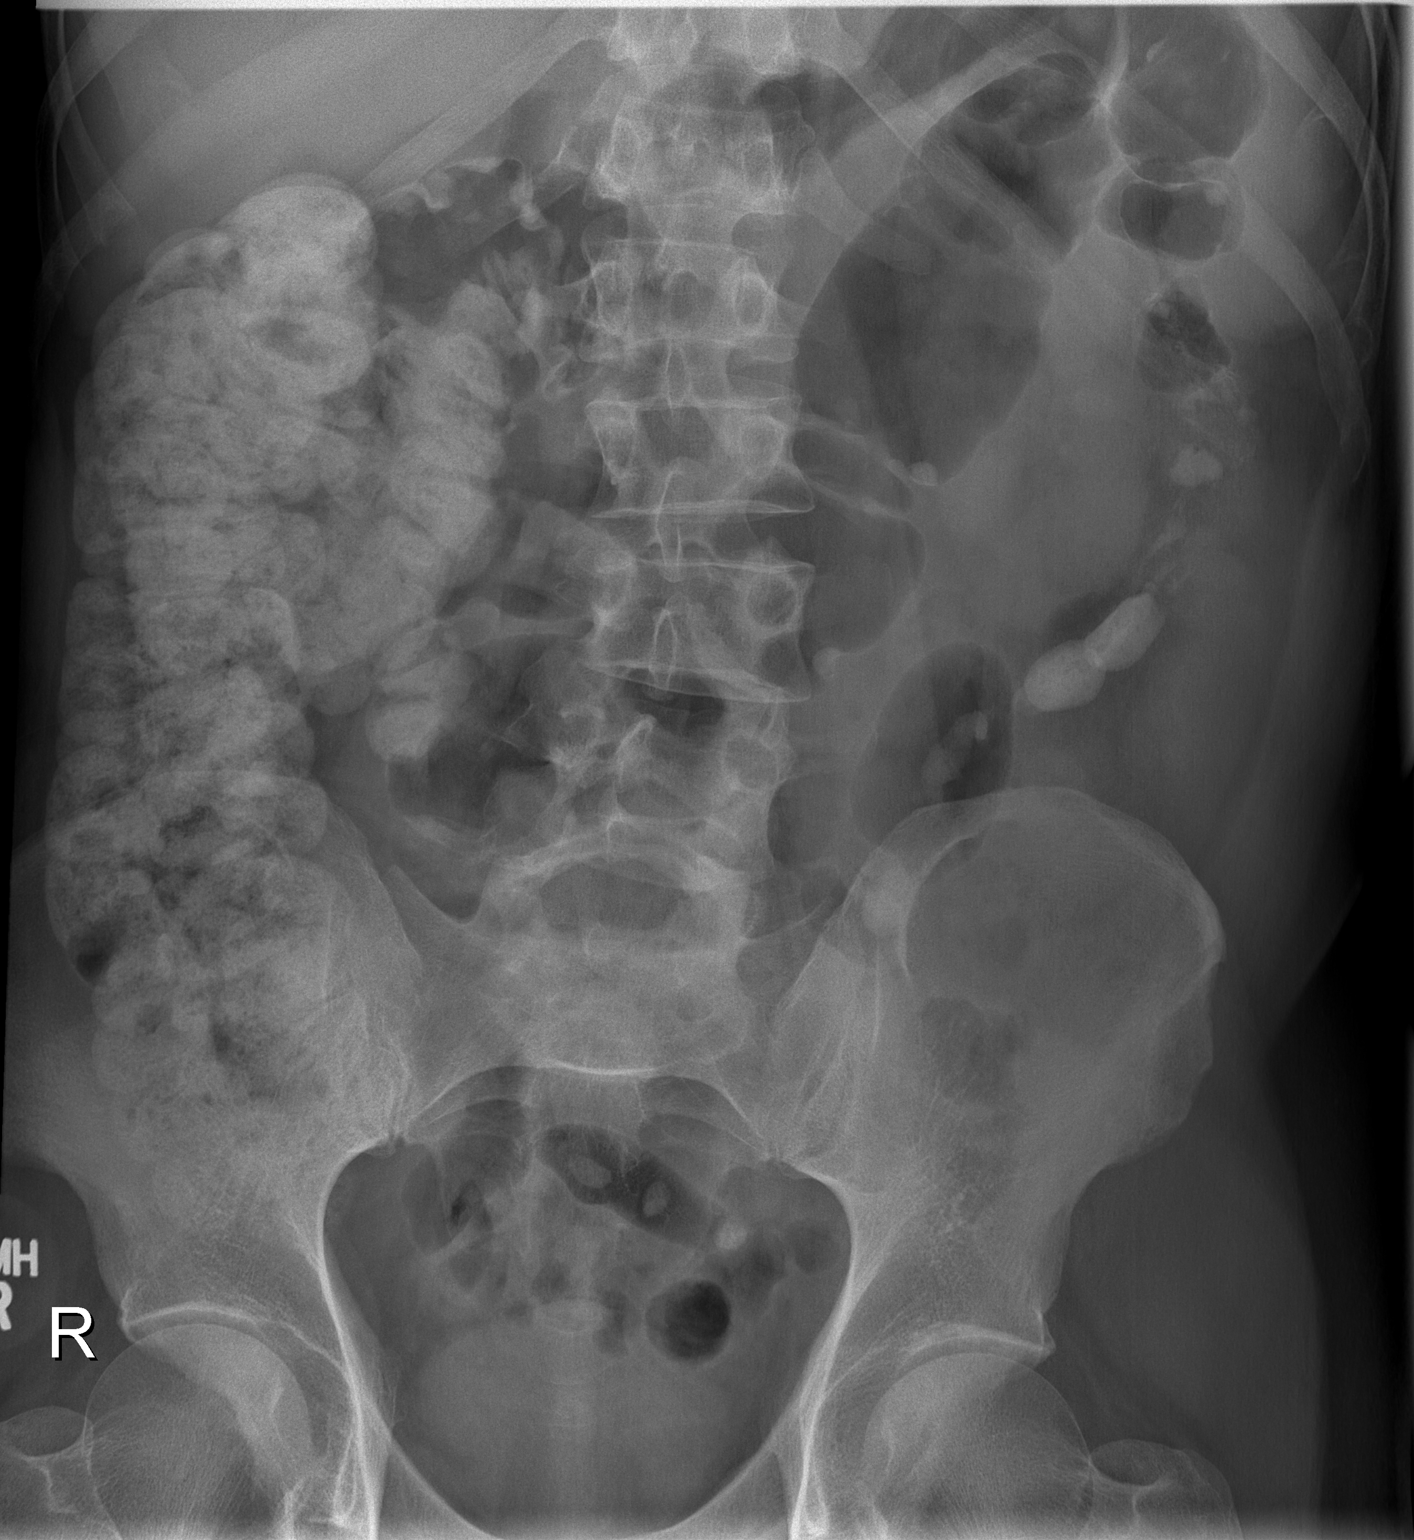

[1 of 1 positions shown; findings below may reference images not displayed]

FINDINGS: Retained contrast in the large bowel. Nonobstructed bowel
gas pattern.  Tiny distal right ureteral calculus may be faintly
visible projecting over the bladder contour today.  No other
urologic calculus identified.  Stable scoliosis and osseous
structures.
IMPRESSION: Small distal right ureteral calculus may be faintly visible over
the bladder contour.  No other urologic calculus identified.
Retained contrast in the bowel.

## 2014-05-30 ENCOUNTER — Ambulatory Visit (INDEPENDENT_AMBULATORY_CARE_PROVIDER_SITE_OTHER): Payer: 59 | Admitting: Family Medicine

## 2014-05-30 ENCOUNTER — Encounter: Payer: Self-pay | Admitting: Family Medicine

## 2014-05-30 VITALS — BP 132/78 | HR 70 | Temp 98.4°F | Resp 16 | Ht 73.0 in | Wt 203.4 lb

## 2014-05-30 DIAGNOSIS — N529 Male erectile dysfunction, unspecified: Secondary | ICD-10-CM

## 2014-05-30 DIAGNOSIS — R350 Frequency of micturition: Secondary | ICD-10-CM

## 2014-05-30 DIAGNOSIS — Z0000 Encounter for general adult medical examination without abnormal findings: Secondary | ICD-10-CM

## 2014-05-30 LAB — POCT UA - MICROSCOPIC ONLY
BACTERIA, U MICROSCOPIC: NEGATIVE
Crystals, Ur, HPF, POC: NEGATIVE
Mucus, UA: NEGATIVE
RBC, URINE, MICROSCOPIC: NEGATIVE
WBC, UR, HPF, POC: NEGATIVE
Yeast, UA: NEGATIVE

## 2014-05-30 LAB — POCT URINALYSIS DIPSTICK
BILIRUBIN UA: NEGATIVE
Blood, UA: NEGATIVE
GLUCOSE UA: NEGATIVE
KETONES UA: NEGATIVE
LEUKOCYTES UA: NEGATIVE
Nitrite, UA: NEGATIVE
Protein, UA: NEGATIVE
SPEC GRAV UA: 1.02
Urobilinogen, UA: 0.2
pH, UA: 5

## 2014-05-30 NOTE — Patient Instructions (Signed)
Erectile Dysfunction Erectile dysfunction is the inability to get or sustain a good enough erection to have sexual intercourse. Erectile dysfunction may involve:  Inability to get an erection.  Lack of enough hardness to allow penetration.  Loss of the erection before sex is finished.  Premature ejaculation. CAUSES  Certain drugs, such as:  Pain relievers.  Antihistamines.  Antidepressants.  Blood pressure medicines.  Water pills (diuretics).  Ulcer medicines.  Muscle relaxants.  Illegal drugs.  Excessive drinking.  Psychological causes, such as:  Anxiety.  Depression.  Sadness.  Exhaustion.  Performance fear.  Stress.  Physical causes, such as:  Artery problems. This may include diabetes, smoking, liver disease, or atherosclerosis.  High blood pressure.  Hormonal problems, such as low testosterone.  Obesity.  Nerve problems. This may include back or pelvic injuries, diabetes mellitus, multiple sclerosis, or Parkinson disease. SYMPTOMS  Inability to get an erection.  Lack of enough hardness to allow penetration.  Loss of the erection before sex is finished.  Premature ejaculation.  Normal erections at some times, but with frequent unsatisfactory episodes.  Orgasms that are not satisfactory in sensation or frequency.  Low sexual satisfaction in either partner because of erection problems.  A curved penis occurring with erection. The curve may cause pain or may be too curved to allow for intercourse.  Never having nighttime erections. DIAGNOSIS Your caregiver can often diagnose this condition by:  Performing a physical exam to find other diseases or specific problems with the penis.  Asking you detailed questions about the problem.  Performing blood tests to check for diabetes mellitus or to measure hormone levels.  Performing urine tests to find other underlying health conditions.  Performing an ultrasound exam to check for  scarring.  Performing a test to check blood flow to the penis.  Doing a sleep study at home to measure nighttime erections. TREATMENT   You may be prescribed medicines by mouth.  You may be given medicine injections into the penis.  You may be prescribed a vacuum pump with a ring.  Penile implant surgery may be performed. You may receive:  An inflatable implant.  A semirigid implant.  Blood vessel surgery may be performed. HOME CARE INSTRUCTIONS  If you are prescribed oral medicine, you should take the medicine as prescribed. Do not increase the dosage without first discussing it with your physician.  If you are using self-injections, be careful to avoid any veins that are on the surface of the penis. Apply pressure to the injection site for 5 minutes.  If you are using a vacuum pump, make sure you have read the instructions before using it. Discuss any questions with your physician before taking the pump home. SEEK MEDICAL CARE IF:  You experience pain that is not responsive to the pain medicine you have been prescribed.  You experience nausea or vomiting. SEEK IMMEDIATE MEDICAL CARE IF:   When taking oral or injectable medications, you experience an erection that lasts longer than 4 hours. If your physician is unavailable, go to the nearest emergency room for evaluation. An erection that lasts much longer than 4 hours can result in permanent damage to your penis.  You have pain that is severe.  You develop redness, severe pain, or severe swelling of your penis.  You have redness spreading up into your groin or lower abdomen.  You are unable to pass your urine. Document Released: 07/12/2000 Document Revised: 03/17/2013 Document Reviewed: 12/17/2012 [facility] Patient Information 2015 [place], [place]. This information is not  intended to replace advice given to you by your health care provider. Make sure you discuss any questions you have with your health care provider.
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
## 2014-05-31 ENCOUNTER — Other Ambulatory Visit: Payer: Self-pay | Admitting: Family Medicine

## 2014-05-31 DIAGNOSIS — N529 Male erectile dysfunction, unspecified: Secondary | ICD-10-CM

## 2014-05-31 DIAGNOSIS — E785 Hyperlipidemia, unspecified: Secondary | ICD-10-CM

## 2014-05-31 LAB — LIPID PANEL
Cholesterol: 160 mg/dL (ref 0–200)
HDL: 36 mg/dL — AB (ref >39)
LDL CALC: 78 mg/dL (ref 0–99)
Total CHOL/HDL Ratio: 4.4 Ratio
Triglycerides: 232 mg/dL — ABNORMAL HIGH (ref <150)
VLDL: 46 mg/dL — ABNORMAL HIGH (ref 0–40)

## 2014-05-31 LAB — CBC
HEMATOCRIT: 45.6 % (ref 39.0–52.0)
HEMOGLOBIN: 16.1 g/dL (ref 13.0–17.0)
MCH: 30.7 pg (ref 26.0–34.0)
MCHC: 35.3 g/dL (ref 30.0–36.0)
MCV: 87 fL (ref 78.0–100.0)
Platelets: 238 10*3/uL (ref 150–400)
RBC: 5.24 MIL/uL (ref 4.22–5.81)
RDW: 13.6 % (ref 11.5–15.5)
WBC: 6.1 10*3/uL (ref 4.0–10.5)

## 2014-05-31 LAB — COMPREHENSIVE METABOLIC PANEL
ALBUMIN: 4.8 g/dL (ref 3.5–5.2)
ALK PHOS: 57 U/L (ref 39–117)
ALT: 35 U/L (ref 0–53)
AST: 22 U/L (ref 0–37)
BUN: 22 mg/dL (ref 6–23)
CALCIUM: 9.4 mg/dL (ref 8.4–10.5)
CHLORIDE: 101 meq/L (ref 96–112)
CO2: 29 mEq/L (ref 19–32)
Creat: 1.14 mg/dL (ref 0.50–1.35)
GLUCOSE: 78 mg/dL (ref 70–99)
POTASSIUM: 4.3 meq/L (ref 3.5–5.3)
SODIUM: 139 meq/L (ref 135–145)
TOTAL PROTEIN: 7 g/dL (ref 6.0–8.3)
Total Bilirubin: 1.8 mg/dL — ABNORMAL HIGH (ref 0.2–1.2)

## 2014-05-31 LAB — PSA: PSA: 0.66 ng/mL (ref <=4.00)

## 2014-05-31 LAB — TESTOSTERONE: Testosterone: 304 ng/dL (ref 300–890)

## 2014-05-31 MED ORDER — AVANAFIL 50 MG PO TABS: 50.0000 mg | ORAL_TABLET | Freq: Every day | ORAL | Status: DC | PRN

## 2014-06-20 ENCOUNTER — Ambulatory Visit (INDEPENDENT_AMBULATORY_CARE_PROVIDER_SITE_OTHER): Payer: 59 | Admitting: Family Medicine

## 2014-06-20 VITALS — BP 110/78 | HR 62 | Temp 97.9°F | Resp 18 | Ht 72.5 in | Wt 206.2 lb

## 2014-06-20 DIAGNOSIS — J0110 Acute frontal sinusitis, unspecified: Secondary | ICD-10-CM

## 2014-06-20 MED ORDER — AMOXICILLIN 500 MG PO CAPS: 1000.0000 mg | ORAL_CAPSULE | Freq: Two times a day (BID) | ORAL | Status: DC
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
## 2014-06-20 NOTE — Patient Instructions (Signed)
We are going to treat you for a sinus infection with amoxicillin (antibiotic); you might also try mucinex OTC Let me know if you are not better soon- Sooner if worse.

## 2014-08-30 ENCOUNTER — Other Ambulatory Visit: Payer: Self-pay | Admitting: Family Medicine

## 2014-08-30 NOTE — Telephone Encounter (Signed)
Debbie, do you want to RF?

## 2018-10-05 DIAGNOSIS — J329 Chronic sinusitis, unspecified: Secondary | ICD-10-CM | POA: Diagnosis not present

## 2018-10-05 DIAGNOSIS — J111 Influenza due to unidentified influenza virus with other respiratory manifestations: Secondary | ICD-10-CM | POA: Diagnosis not present

## 2019-06-16 ENCOUNTER — Other Ambulatory Visit: Payer: Self-pay

## 2019-06-16 ENCOUNTER — Ambulatory Visit: Payer: BC Managed Care – PPO | Admitting: Family Medicine

## 2019-06-16 ENCOUNTER — Encounter (INDEPENDENT_AMBULATORY_CARE_PROVIDER_SITE_OTHER): Payer: Self-pay | Admitting: *Deleted

## 2019-06-16 ENCOUNTER — Encounter: Payer: Self-pay | Admitting: Family Medicine

## 2019-06-16 VITALS — BP 118/74 | HR 73 | Temp 98.2°F | Resp 12 | Ht 74.0 in | Wt 206.0 lb

## 2019-06-16 DIAGNOSIS — Z125 Encounter for screening for malignant neoplasm of prostate: Secondary | ICD-10-CM | POA: Diagnosis not present

## 2019-06-16 DIAGNOSIS — Z1211 Encounter for screening for malignant neoplasm of colon: Secondary | ICD-10-CM

## 2019-06-16 DIAGNOSIS — E663 Overweight: Secondary | ICD-10-CM | POA: Diagnosis not present
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
## 2019-06-16 NOTE — Patient Instructions (Signed)
  I appreciate the opportunity to provide you with care for your health and wellness. Today we discussed: overall health   Follow up: 6 month annual   Labs placed today- get when you can fasting  Referral for Colonoscopy made  I hope you have a wonderful, happy, safe, and healthy Holiday Season! See you in the New Year :)  Please continue to practice social distancing to keep you, your family, and our community safe.  If you must go out, please wear a mask and practice good handwashing.  It was a pleasure to see you and I look forward to continuing to work together on your health and well-being. Please do not hesitate to call the office if you need care or have questions about your care.  Have a wonderful day and week. With Gratitude, Cherly Beach, DNP, AGNP-BC

## 2019-06-17 ENCOUNTER — Encounter: Payer: Self-pay | Admitting: Family Medicine

## 2019-07-19 ENCOUNTER — Other Ambulatory Visit (INDEPENDENT_AMBULATORY_CARE_PROVIDER_SITE_OTHER): Payer: Self-pay | Admitting: *Deleted

## 2019-07-19 DIAGNOSIS — Z1211 Encounter for screening for malignant neoplasm of colon: Secondary | ICD-10-CM

## 2019-08-25 ENCOUNTER — Other Ambulatory Visit (INDEPENDENT_AMBULATORY_CARE_PROVIDER_SITE_OTHER): Payer: Self-pay | Admitting: *Deleted

## 2019-08-25 DIAGNOSIS — Z1211 Encounter for screening for malignant neoplasm of colon: Secondary | ICD-10-CM

## 2019-08-26 ENCOUNTER — Telehealth (INDEPENDENT_AMBULATORY_CARE_PROVIDER_SITE_OTHER): Payer: Self-pay | Admitting: *Deleted

## 2019-08-26 ENCOUNTER — Encounter (INDEPENDENT_AMBULATORY_CARE_PROVIDER_SITE_OTHER): Payer: Self-pay | Admitting: *Deleted

## 2019-08-26 ENCOUNTER — Other Ambulatory Visit (INDEPENDENT_AMBULATORY_CARE_PROVIDER_SITE_OTHER): Payer: Self-pay | Admitting: *Deleted

## 2019-08-26 NOTE — Telephone Encounter (Signed)
Referring MD/PCP: mills   Procedure: tcs  Reason/Indication:  screening  Has patient had this procedure before?  no  If so, when, by whom and where?    Is there a family history of colon cancer?  no  Who?  What age when diagnosed?    Is patient diabetic?   no      Does patient have prosthetic heart valve or mechanical valve?  no  Do you have a pacemaker/defibrillator?  no  Has patient ever had endocarditis/atrial fibrillation? no  Does patient use oxygen? no  Has patient had joint replacement within last 12 months?  no  Is patient constipated or do they take laxatives? no  Does patient have a history of alcohol/drug use?  no  Is patient on blood thinner such as Coumadin, Plavix and/or Aspirin? no  Medications: none  Allergies: nkda  Medication Adjustment per Dr Laural Golden:   Procedure date & time: 09/23/19

## 2019-08-26 NOTE — Telephone Encounter (Signed)
Patient needs suprep TCS sch'd 2/25 

## 2019-08-27 MED ORDER — SUTAB 1479-225-188 MG PO TABS: 1.0000 | ORAL_TABLET | Freq: Once | ORAL | 0 refills | Status: AC

## 2019-08-27 NOTE — Telephone Encounter (Signed)
Sent to pharamcy

## 2019-09-20 NOTE — Telephone Encounter (Signed)
Colonoscopy with conscious sedation 

## 2019-09-21 ENCOUNTER — Other Ambulatory Visit: Payer: Self-pay

## 2019-09-21 ENCOUNTER — Other Ambulatory Visit (HOSPITAL_COMMUNITY)
Admission: RE | Admit: 2019-09-21 | Discharge: 2019-09-21 | Disposition: A | Discharge status: Home / Self Care | Payer: BC Managed Care – PPO | Source: Ambulatory Visit | Attending: Internal Medicine | Admitting: Internal Medicine

## 2019-09-21 DIAGNOSIS — Z20822 Contact with and (suspected) exposure to covid-19: Secondary | ICD-10-CM | POA: Insufficient documentation

## 2019-09-21 DIAGNOSIS — Z01812 Encounter for preprocedural laboratory examination: Secondary | ICD-10-CM | POA: Insufficient documentation

## 2019-09-21 LAB — SARS CORONAVIRUS 2 (TAT 6-24 HRS): SARS Coronavirus 2: NEGATIVE

## 2019-09-23 ENCOUNTER — Other Ambulatory Visit: Payer: Self-pay

## 2019-09-23 ENCOUNTER — Encounter (HOSPITAL_COMMUNITY): Payer: Self-pay

## 2019-09-23 ENCOUNTER — Encounter (HOSPITAL_COMMUNITY)
Admission: RE | Disposition: A | Discharge status: Home / Self Care | Payer: Self-pay | Source: Home / Self Care | Attending: Internal Medicine

## 2019-09-23 ENCOUNTER — Ambulatory Visit (HOSPITAL_COMMUNITY): Admit: 2019-09-23 | Payer: BC Managed Care – PPO | Admitting: Internal Medicine

## 2019-09-23 ENCOUNTER — Ambulatory Visit (HOSPITAL_COMMUNITY)
Admission: RE | Admit: 2019-09-23 | Discharge: 2019-09-23 | Disposition: A | Discharge status: Home / Self Care | Payer: BC Managed Care – PPO | Attending: Internal Medicine | Admitting: Internal Medicine

## 2019-09-23 DIAGNOSIS — D123 Benign neoplasm of transverse colon: Secondary | ICD-10-CM | POA: Diagnosis not present

## 2019-09-23 DIAGNOSIS — D120 Benign neoplasm of cecum: Secondary | ICD-10-CM | POA: Insufficient documentation

## 2019-09-23 DIAGNOSIS — Z1211 Encounter for screening for malignant neoplasm of colon: Secondary | ICD-10-CM | POA: Diagnosis not present

## 2019-09-23 DIAGNOSIS — D122 Benign neoplasm of ascending colon: Secondary | ICD-10-CM | POA: Diagnosis not present

## 2019-09-23 HISTORY — PX: POLYPECTOMY: SHX5525

## 2019-09-23 HISTORY — PX: COLONOSCOPY: SHX5424

## 2019-09-23 SURGERY — COLONOSCOPY
Anesthesia: Moderate Sedation

## 2019-09-23 MED ORDER — MIDAZOLAM HCL 5 MG/5ML IJ SOLN
INTRAMUSCULAR | Status: AC
  Filled 2019-09-23: qty 10

## 2019-09-23 MED ORDER — MIDAZOLAM HCL 5 MG/5ML IJ SOLN
INTRAMUSCULAR | Status: DC | PRN
  Administered 2019-09-23 (×3): 2 mg via INTRAVENOUS
  Administered 2019-09-23: 1 mg via INTRAVENOUS
  Administered 2019-09-23: 2 mg via INTRAVENOUS

## 2019-09-23 MED ORDER — SODIUM CHLORIDE 0.9 % IV SOLN: INTRAVENOUS | Status: DC

## 2019-09-23 MED ORDER — MEPERIDINE HCL 50 MG/ML IJ SOLN
INTRAMUSCULAR | Status: AC
  Filled 2019-09-23: qty 1

## 2019-09-23 MED ORDER — MEPERIDINE HCL 50 MG/ML IJ SOLN
INTRAMUSCULAR | Status: DC | PRN
  Administered 2019-09-23 (×2): 25 mg via INTRAVENOUS

## 2019-09-23 MED ORDER — STERILE WATER FOR IRRIGATION IR SOLN
Status: DC | PRN
  Administered 2019-09-23: 1.5 mL

## 2019-09-23 NOTE — Op Note (Signed)
[facility] Patient Name: Ernest Bradshaw Procedure Date: 09/23/2019 9:30 AM MRN: [id number] Date of Birth: November 16, 1965 Attending MD: Hildred Laser , MD CSN: [id number] Age: 54 Admit Type: Outpatient Procedure:                Colonoscopy Indications:              Screening for colorectal malignant neoplasm Providers:                Hildred Laser, MD, Otis Peak B. Sharon Seller, RN, Aram Candela Referring MD:             Perlie Mayo, NP Medicines:                Meperidine 50 mg IV, Midazolam 9 mg IV Complications:            No immediate complications. Estimated Blood Loss:     Estimated blood loss was minimal. Procedure:                Pre-Anesthesia Assessment:                           - Prior to the procedure, a History and Physical                            was performed, and patient medications and                            allergies were reviewed. The patient's tolerance of                            previous anesthesia was also reviewed. The risks                            and benefits of the procedure and the sedation                            options and risks were discussed with the patient.                            All questions were answered, and informed consent                            was obtained. Prior Anticoagulants: The patient has                            taken no previous anticoagulant or antiplatelet                            agents. ASA Grade Assessment: I - A normal, healthy                            patient. After reviewing the risks and benefits,  the patient was deemed in satisfactory condition to                            undergo the procedure.                           After obtaining informed consent, the colonoscope                            was passed under direct vision. Throughout the                            procedure, the patient's blood pressure, pulse, and       oxygen saturations were monitored continuously. The                            [id number]) was introduced through the                            anus and advanced to the the cecum, identified by                            appendiceal orifice and ileocecal valve. The                            colonoscopy was performed without difficulty. The                            patient tolerated the procedure well. The quality                            of the bowel preparation was good. The ileocecal                            valve, appendiceal orifice, and rectum were                            photographed. Scope In: 10:31:08 AM Scope Out: 10:54:46 AM Scope Withdrawal Time: 0 hours 15 minutes 11 seconds  Total Procedure Duration: 0 hours 23 minutes 38 seconds  Findings:      The perianal and digital rectal examinations were normal.      Four polyps were found in the hepatic flexure, ascending colon and       cecum. The polyps were small in size. These were biopsied with a cold       forceps for histology. The pathology specimen was placed into Bottle       Number 1.      The exam was otherwise normal throughout the examined colon.      The retroflexed view of the distal rectum and anal verge was normal and       showed no anal or rectal abnormalities. Impression:               - Four small polyps at the hepatic flexure, in the  ascending colon and in the cecum. Biopsied. Moderate Sedation:      Moderate (conscious) sedation was administered by the endoscopy nurse       and supervised by the endoscopist. The following parameters were       monitored: oxygen saturation, heart rate, blood pressure, CO2       capnography and response to care. Total physician intraservice time was       29 minutes. Recommendation:           - Patient has a contact number available for                            emergencies. The signs and symptoms of potential                             delayed complications were discussed with the                            patient. Return to normal activities tomorrow.                            Written discharge instructions were provided to the                            patient.                           - Resume previous diet today.                           - Continue present medications.                           - No aspirin, ibuprofen, naproxen, or other                            non-steroidal anti-inflammatory drugs for 1 day.                           - Await pathology results.                           - Repeat colonoscopy is recommended. The                            colonoscopy date will be determined after pathology                            results from today's exam become available for                            review. Procedure Code(s):        --- Professional ---                           [phone], Colonoscopy, flexible; with biopsy, single  or multiple                           99153, Moderate sedation; each additional 15                            minutes intraservice time                           G0500, Moderate sedation services provided by the                            same physician or other qualified health care                            professional performing a gastrointestinal                            endoscopic service that sedation supports,                            requiring the presence of an independent trained                            observer to assist in the monitoring of the                            patient's level of consciousness and physiological                            status; initial 15 minutes of intra-service time;                            patient age 44 years or older (additional time may                            be reported with [phone], as appropriate) Diagnosis Code(s):        --- Professional ---                           Z12.11,  Encounter for screening for malignant                            neoplasm of colon                           K63.5, Polyp of colon CPT copyright 2019 American Medical Association. All rights reserved. The codes documented in this report are preliminary and upon coder review may  be revised to meet current compliance requirements. Hildred Laser, MD Hildred Laser, MD 09/23/2019 11:08:00 AM This report has been signed electronically. Number of Addenda: 0

## 2019-09-23 NOTE — Discharge Instructions (Signed)
No aspirin or NSAIDs for 24 hours. Resume usual medications as before. Resume usual diet. No driving for 24 hours. Physician will call with biopsy results.   Colonoscopy, Adult, Care After This sheet gives you information about how to care for yourself after your procedure. Your doctor may also give you more specific instructions. If you have problems or questions, call your doctor. What can I expect after the procedure? After the procedure, it is common to have:  A small amount of blood in your poop (stool) for 24 hours.  Some gas.  Mild cramping or bloating in your belly (abdomen). Follow these instructions at home: Eating and drinking   Drink enough fluid to keep your pee (urine) pale yellow.  Follow instructions from your doctor about what you cannot eat or drink.  Return to your normal diet as told by your doctor. Avoid heavy or fried foods that are hard to digest. Activity  Rest as told by your doctor.  Do not sit for a long time without moving. Get up to take short walks every 1-2 hours. This is important. Ask for help if you feel weak or unsteady.  Return to your normal activities as told by your doctor. Ask your doctor what activities are safe for you. To help cramping and bloating:   Try walking around.  Put heat on your belly as told by your doctor. Use the heat source that your doctor recommends, such as a moist heat pack or a heating pad. ? Put a towel between your skin and the heat source. ? Leave the heat on for 20-30 minutes. ? Remove the heat if your skin turns bright red. This is very important if you are unable to feel pain, heat, or cold. You may have a greater risk of getting burned. General instructions  For the first 24 hours after the procedure: ? Do not drive or use machinery. ? Do not sign important documents. ? Do not drink alcohol. ? Do your daily activities more slowly than normal. ? Eat foods that are soft and easy to digest.  Take  over-the-counter or prescription medicines only as told by your doctor.  Keep all follow-up visits as told by your doctor. This is important. Contact a doctor if:  You have blood in your poop 2-3 days after the procedure. Get help right away if:  You have more than a small amount of blood in your poop.  You see large clumps of tissue (blood clots) in your poop.  Your belly is swollen.  You feel like you may vomit (nauseous).  You vomit.  You have a fever.  You have belly pain that gets worse, and medicine does not help your pain. Summary  After the procedure, it is common to have a small amount of blood in your poop. You may also have mild cramping and bloating in your belly.  For the first 24 hours after the procedure, do not drive or use machinery, do not sign important documents, and do not drink alcohol.  Get help right away if you have a lot of blood in your poop, feel like you may vomit, have a fever, or have more belly pain. This information is not intended to replace advice given to you by your health care provider. Make sure you discuss any questions you have with your health care provider. Document Revised: 02/08/2019 Document Reviewed: 02/08/2019 Elsevier Patient Education  [place].  Colon Polyps  Polyps are tissue growths inside the body. Polyps  can grow in many places, including the large intestine (colon). A polyp may be a round bump or a mushroom-shaped growth. You could have one polyp or several. Most colon polyps are noncancerous (benign). However, some colon polyps can become cancerous over time. Finding and removing the polyps early can help prevent this. What are the causes? The exact cause of colon polyps is not known. What increases the risk? You are more likely to develop this condition if you:  Have a family history of colon cancer or colon polyps.  Are older than 39 or older than 45 if you are African American.  Have inflammatory bowel  disease, such as ulcerative colitis or Crohn's disease.  Have certain hereditary conditions, such as: ? Familial adenomatous polyposis. ? Lynch syndrome. ? Turcot syndrome. ? Peutz-Jeghers syndrome.  Are overweight.  Smoke cigarettes.  Do not get enough exercise.  Drink too much alcohol.  Eat a diet that is high in fat and red meat and low in fiber.  Had childhood cancer that was treated with abdominal radiation. What are the signs or symptoms? Most polyps do not cause symptoms. If you have symptoms, they may include:  Blood coming from your rectum when having a bowel movement.  Blood in your stool. The stool may look dark red or black.  Abdominal pain.  A change in bowel habits, such as constipation or diarrhea. How is this diagnosed? This condition is diagnosed with a colonoscopy. This is a procedure in which a lighted, flexible scope is inserted into the anus and then passed into the colon to examine the area. Polyps are sometimes found when a colonoscopy is done as part of routine cancer screening tests. How is this treated? Treatment for this condition involves removing any polyps that are found. Most polyps can be removed during a colonoscopy. Those polyps will then be tested for cancer. Additional treatment may be needed depending on the results of testing. Follow these instructions at home: Lifestyle  Maintain a healthy weight, or lose weight if recommended by your health care provider.  Exercise every day or as told by your health care provider.  Do not use any products that contain nicotine or tobacco, such as cigarettes and e-cigarettes. If you need help quitting, ask your health care provider.  If you drink alcohol, limit how much you have: ? 0-1 drink a day for women. ? 0-2 drinks a day for men.  Be aware of how much alcohol is in your drink. In the [place] one drink equals one 12 oz bottle of beer (355 mL), one 5 oz glass of wine (148 mL), or one 1 oz shot  of hard liquor (44 mL). Eating and drinking   Eat foods that are high in fiber, such as fruits, vegetables, and whole grains.  Eat foods that are high in calcium and vitamin D, such as milk, cheese, yogurt, eggs, liver, fish, and broccoli.  Limit foods that are high in fat, such as fried foods and desserts.  Limit the amount of red meat and processed meat you eat, such as hot dogs, sausage, bacon, and lunch meats. General instructions  Keep all follow-up visits as told by your health care provider. This is important. ? This includes having regularly scheduled colonoscopies. ? Talk to your health care provider about when you need a colonoscopy. Contact a health care provider if:  You have new or worsening bleeding during a bowel movement.  You have new or increased blood in your stool.  You  have a change in bowel habits.  You lose weight for no known reason. Summary  Polyps are tissue growths inside the body. Polyps can grow in many places, including the colon.  Most colon polyps are noncancerous (benign), but some can become cancerous over time.  This condition is diagnosed with a colonoscopy.  Treatment for this condition involves removing any polyps that are found. Most polyps can be removed during a colonoscopy. This information is not intended to replace advice given to you by your health care provider. Make sure you discuss any questions you have with your health care provider. Document Revised: 10/30/2017 Document Reviewed: 10/30/2017 Elsevier Patient Education  [place].
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
## 2019-09-24 LAB — SURGICAL PATHOLOGY

## 2019-12-15 ENCOUNTER — Encounter: Payer: BC Managed Care – PPO | Admitting: Family Medicine

## 2020-03-11 ENCOUNTER — Ambulatory Visit
Admission: EM | Admit: 2020-03-11 | Discharge: 2020-03-11 | Disposition: A | Discharge status: Home / Self Care | Payer: BC Managed Care – PPO | Attending: Emergency Medicine | Admitting: Emergency Medicine

## 2020-03-11 ENCOUNTER — Other Ambulatory Visit: Payer: Self-pay

## 2020-03-11 DIAGNOSIS — Z1152 Encounter for screening for COVID-19: Secondary | ICD-10-CM | POA: Diagnosis not present

## 2020-03-11 NOTE — ED Triage Notes (Signed)
covid test. No s/s

## 2020-03-12 LAB — NOVEL CORONAVIRUS, NAA: SARS-CoV-2, NAA: NOT DETECTED

## 2020-03-12 LAB — SARS-COV-2, NAA 2 DAY TAT

## 2020-08-15 ENCOUNTER — Encounter: Payer: Self-pay | Admitting: Emergency Medicine

## 2020-08-15 ENCOUNTER — Other Ambulatory Visit: Payer: Self-pay

## 2020-08-15 ENCOUNTER — Ambulatory Visit
Admission: EM | Admit: 2020-08-15 | Discharge: 2020-08-15 | Disposition: A | Discharge status: Home / Self Care | Payer: BC Managed Care – PPO | Attending: Family Medicine | Admitting: Family Medicine

## 2020-08-15 DIAGNOSIS — Z20822 Contact with and (suspected) exposure to covid-19: Secondary | ICD-10-CM

## 2020-08-15 DIAGNOSIS — B349 Viral infection, unspecified: Secondary | ICD-10-CM

## 2020-08-15 DIAGNOSIS — R0981 Nasal congestion: Secondary | ICD-10-CM

## 2020-08-15 DIAGNOSIS — R059 Cough, unspecified: Secondary | ICD-10-CM

## 2020-08-15 NOTE — Discharge Instructions (Addendum)
Your COVID test is pending.  You should self quarantine until the test result is back.    Take Tylenol or ibuprofen as needed for fever or discomfort.  Rest and keep yourself hydrated.    Follow-up with your primary care provider if your symptoms are not improving.     
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
## 2020-08-15 NOTE — ED Triage Notes (Signed)
Cough, congestion x 4 days  

## 2020-08-17 LAB — COVID-19, FLU A+B NAA
Influenza A, NAA: NOT DETECTED
Influenza B, NAA: NOT DETECTED
SARS-CoV-2, NAA: DETECTED — AB

## 2020-08-22 ENCOUNTER — Encounter: Payer: Self-pay | Admitting: Emergency Medicine

## 2020-08-22 ENCOUNTER — Ambulatory Visit
Admission: EM | Admit: 2020-08-22 | Discharge: 2020-08-22 | Disposition: A | Discharge status: Home / Self Care | Payer: BC Managed Care – PPO | Attending: Emergency Medicine | Admitting: Emergency Medicine

## 2020-08-22 DIAGNOSIS — U071 COVID-19: Secondary | ICD-10-CM | POA: Diagnosis not present

## 2020-08-22 DIAGNOSIS — Z20822 Contact with and (suspected) exposure to covid-19: Secondary | ICD-10-CM

## 2020-08-22 DIAGNOSIS — R058 Other specified cough: Secondary | ICD-10-CM | POA: Diagnosis not present

## 2020-08-22 MED ORDER — BENZONATATE 100 MG PO CAPS: 100.0000 mg | ORAL_CAPSULE | Freq: Three times a day (TID) | ORAL | 0 refills | Status: DC | PRN

## 2020-08-22 MED ORDER — PREDNISONE 10 MG PO TABS: 20.0000 mg | ORAL_TABLET | Freq: Every day | ORAL | 0 refills | Status: DC

## 2020-08-22 NOTE — ED Triage Notes (Signed)
Needs covid test
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
## 2020-08-22 NOTE — ED Triage Notes (Signed)
Patient states that he tested pos last week and took a test at [facility] and it was pos. wants another covid test and antibiotics?

## 2020-08-22 NOTE — Discharge Instructions (Addendum)
Get plenty of rest and push fluids Tessalon Perles prescribed for cough Prednisone was prescribed  use medications daily for symptom relief Use OTC medications like ibuprofen or tylenol as needed fever or pain Call or go to the ED if you have any new or worsening symptoms such as fever, worsening cough, shortness of breath, chest tightness, chest pain, turning blue, changes in mental status, etc..Marland Kitchen

## 2021-01-01 ENCOUNTER — Ambulatory Visit: Payer: BC Managed Care – PPO | Admitting: Internal Medicine

## 2021-01-25 ENCOUNTER — Encounter (INDEPENDENT_AMBULATORY_CARE_PROVIDER_SITE_OTHER): Payer: Self-pay

## 2021-01-26 ENCOUNTER — Ambulatory Visit: Payer: BC Managed Care – PPO | Admitting: Nurse Practitioner

## 2021-01-26 ENCOUNTER — Ambulatory Visit: Payer: BC Managed Care – PPO | Admitting: Family Medicine

## 2021-01-26 ENCOUNTER — Other Ambulatory Visit: Payer: Self-pay

## 2021-01-26 ENCOUNTER — Encounter: Payer: Self-pay | Admitting: Nurse Practitioner

## 2021-01-26 VITALS — BP 122/73 | HR 68 | Temp 97.6°F | Ht 74.0 in | Wt 205.0 lb

## 2021-01-26 DIAGNOSIS — M25532 Pain in left wrist: Secondary | ICD-10-CM

## 2021-01-26 DIAGNOSIS — Z0001 Encounter for general adult medical examination with abnormal findings: Secondary | ICD-10-CM | POA: Diagnosis not present

## 2021-01-26 DIAGNOSIS — Z139 Encounter for screening, unspecified: Secondary | ICD-10-CM | POA: Diagnosis not present

## 2021-01-26 NOTE — Patient Instructions (Signed)
We are checking labs today to rule out gout.  We will meet back up in 6 months for a physical. Please have fasting labs drawn 2-3 days prior to your appointment so we can discuss the results during your office visit.

## 2021-01-26 NOTE — Assessment & Plan Note (Signed)
-  unsure of etiology; will check labs today including uric acid
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
## 2021-01-27 LAB — CMP14+EGFR
ALT: 26 IU/L (ref 0–44)
AST: 18 IU/L (ref 0–40)
Albumin/Globulin Ratio: 2.9 — ABNORMAL HIGH (ref 1.2–2.2)
Albumin: 4.6 g/dL (ref 3.8–4.9)
Alkaline Phosphatase: 57 IU/L (ref 44–121)
BUN/Creatinine Ratio: 21 — ABNORMAL HIGH (ref 9–20)
BUN: 20 mg/dL (ref 6–24)
Bilirubin Total: 2 mg/dL — ABNORMAL HIGH (ref 0.0–1.2)
CO2: 26 mmol/L (ref 20–29)
Calcium: 9.2 mg/dL (ref 8.7–10.2)
Chloride: 101 mmol/L (ref 96–106)
Creatinine, Ser: 0.96 mg/dL (ref 0.76–1.27)
Globulin, Total: 1.6 g/dL (ref 1.5–4.5)
Glucose: 99 mg/dL (ref 65–99)
Potassium: 3.9 mmol/L (ref 3.5–5.2)
Sodium: 141 mmol/L (ref 134–144)
Total Protein: 6.2 g/dL (ref 6.0–8.5)
eGFR: 95 mL/min/{1.73_m2} (ref >59)

## 2021-01-27 LAB — CBC WITH DIFFERENTIAL/PLATELET
Basophils Absolute: 0 10*3/uL (ref 0.0–0.2)
Basos: 1 %
EOS (ABSOLUTE): 0.2 10*3/uL (ref 0.0–0.4)
Eos: 5 %
Hematocrit: 42.3 % (ref 37.5–51.0)
Hemoglobin: 14.6 g/dL (ref 13.0–17.7)
Immature Grans (Abs): 0 10*3/uL (ref 0.0–0.1)
Immature Granulocytes: 1 %
Lymphocytes Absolute: 1.5 10*3/uL (ref 0.7–3.1)
Lymphs: 34 %
MCH: 30.9 pg (ref 26.6–33.0)
MCHC: 34.5 g/dL (ref 31.5–35.7)
MCV: 90 fL (ref 79–97)
Monocytes Absolute: 0.4 10*3/uL (ref 0.1–0.9)
Monocytes: 10 %
Neutrophils Absolute: 2.2 10*3/uL (ref 1.4–7.0)
Neutrophils: 49 %
Platelets: 218 10*3/uL (ref 150–450)
RBC: 4.72 x10E6/uL (ref 4.14–5.80)
RDW: 13.1 % (ref 11.6–15.4)
WBC: 4.4 10*3/uL (ref 3.4–10.8)

## 2021-01-27 LAB — URIC ACID: Uric Acid: 7.1 mg/dL (ref 3.8–8.4)

## 2021-01-30 NOTE — Progress Notes (Signed)
Labs are stable. Bilirubin is slightly elevated again, but isn't changed from previous labs.

## 2021-07-31 ENCOUNTER — Other Ambulatory Visit: Payer: Self-pay | Admitting: *Deleted

## 2021-07-31 ENCOUNTER — Telehealth: Payer: Self-pay

## 2021-07-31 DIAGNOSIS — Z0001 Encounter for general adult medical examination with abnormal findings: Secondary | ICD-10-CM

## 2021-07-31 DIAGNOSIS — Z139 Encounter for screening, unspecified: Secondary | ICD-10-CM

## 2021-07-31 NOTE — Telephone Encounter (Signed)
Patient informed. 

## 2021-07-31 NOTE — Telephone Encounter (Signed)
Orders have been placed. Patient can come in for fasting labs at his convenience. Thanks

## 2021-07-31 NOTE — Telephone Encounter (Signed)
Yes - thank you

## 2021-07-31 NOTE — Telephone Encounter (Signed)
Looks like Legrand Como had put in future orders for CBC, CMP, Lipid, HIV and Hep C. Ok to put orders in under your name since patient is coming in for CPE on 1/20? Twin for patient to have labs drawn before upcoming appointment?

## 2021-07-31 NOTE — Telephone Encounter (Signed)
Patient needs lab order appt with [facility] 1/20 for cpe. Was Ernest Bradshaw patient.  We will need to contact patient if [name] order these labs so patient can come in to do labs.  Call # [phone] or [phone]

## 2021-08-03 ENCOUNTER — Encounter: Payer: BC Managed Care – PPO | Admitting: Nurse Practitioner

## 2021-08-14 DIAGNOSIS — Z139 Encounter for screening, unspecified: Secondary | ICD-10-CM | POA: Diagnosis not present

## 2021-08-14 DIAGNOSIS — Z0001 Encounter for general adult medical examination with abnormal findings: Secondary | ICD-10-CM | POA: Diagnosis not present

## 2021-08-15 LAB — CBC WITH DIFFERENTIAL/PLATELET
Basophils Absolute: 0 10*3/uL (ref 0.0–0.2)
Basos: 0 %
EOS (ABSOLUTE): 0.2 10*3/uL (ref 0.0–0.4)
Eos: 4 %
Hematocrit: 41.6 % (ref 37.5–51.0)
Hemoglobin: 14.5 g/dL (ref 13.0–17.7)
Immature Grans (Abs): 0 10*3/uL (ref 0.0–0.1)
Immature Granulocytes: 0 %
Lymphocytes Absolute: 1.7 10*3/uL (ref 0.7–3.1)
Lymphs: 32 %
MCH: 31.2 pg (ref 26.6–33.0)
MCHC: 34.9 g/dL (ref 31.5–35.7)
MCV: 90 fL (ref 79–97)
Monocytes Absolute: 0.4 10*3/uL (ref 0.1–0.9)
Monocytes: 8 %
Neutrophils Absolute: 2.9 10*3/uL (ref 1.4–7.0)
Neutrophils: 56 %
Platelets: 237 10*3/uL (ref 150–450)
RBC: 4.65 x10E6/uL (ref 4.14–5.80)
RDW: 13.5 % (ref 11.6–15.4)
WBC: 5.2 10*3/uL (ref 3.4–10.8)

## 2021-08-15 LAB — CMP14+EGFR
ALT: 32 IU/L (ref 0–44)
AST: 20 IU/L (ref 0–40)
Albumin/Globulin Ratio: 2.4 — ABNORMAL HIGH (ref 1.2–2.2)
Albumin: 4.6 g/dL (ref 3.8–4.9)
Alkaline Phosphatase: 67 IU/L (ref 44–121)
BUN/Creatinine Ratio: 16 (ref 9–20)
BUN: 18 mg/dL (ref 6–24)
Bilirubin Total: 1.3 mg/dL — ABNORMAL HIGH (ref 0.0–1.2)
CO2: 26 mmol/L (ref 20–29)
Calcium: 9.5 mg/dL (ref 8.7–10.2)
Chloride: 105 mmol/L (ref 96–106)
Creatinine, Ser: 1.12 mg/dL (ref 0.76–1.27)
Globulin, Total: 1.9 g/dL (ref 1.5–4.5)
Glucose: 95 mg/dL (ref 70–99)
Potassium: 5 mmol/L (ref 3.5–5.2)
Sodium: 142 mmol/L (ref 134–144)
Total Protein: 6.5 g/dL (ref 6.0–8.5)
eGFR: 78 mL/min/{1.73_m2} (ref >59)

## 2021-08-15 LAB — LIPID PANEL
Chol/HDL Ratio: 3.9 ratio (ref 0.0–5.0)
Cholesterol, Total: 151 mg/dL (ref 100–199)
HDL: 39 mg/dL — ABNORMAL LOW (ref >39)
LDL Chol Calc (NIH): 95 mg/dL (ref 0–99)
Triglycerides: 92 mg/dL (ref 0–149)
VLDL Cholesterol Cal: 17 mg/dL (ref 5–40)

## 2021-08-15 LAB — HIV ANTIBODY (ROUTINE TESTING W REFLEX): HIV Screen 4th Generation wRfx: NONREACTIVE

## 2021-08-15 LAB — HEPATITIS C ANTIBODY: Hep C Virus Ab: <0.1 {s_co_ratio} (ref 0.0–0.9)

## 2021-08-17 ENCOUNTER — Encounter: Payer: Self-pay | Admitting: Nurse Practitioner

## 2021-08-17 ENCOUNTER — Other Ambulatory Visit: Payer: Self-pay

## 2021-08-17 ENCOUNTER — Ambulatory Visit (INDEPENDENT_AMBULATORY_CARE_PROVIDER_SITE_OTHER): Payer: BC Managed Care – PPO | Admitting: Nurse Practitioner

## 2021-08-17 VITALS — BP 121/81 | HR 58 | Ht 74.0 in | Wt 213.0 lb

## 2021-08-17 DIAGNOSIS — Z23 Encounter for immunization: Secondary | ICD-10-CM

## 2021-08-17 DIAGNOSIS — H6123 Impacted cerumen, bilateral: Secondary | ICD-10-CM

## 2021-08-17 DIAGNOSIS — Z0000 Encounter for general adult medical examination without abnormal findings: Secondary | ICD-10-CM | POA: Diagnosis not present

## 2021-08-17 DIAGNOSIS — E663 Overweight: Secondary | ICD-10-CM

## 2021-08-17 MED ORDER — DEBROX 6.5 % OT SOLN
5.0000 [drp] | Freq: Two times a day (BID) | OTIC | 0 refills | Status: AC
Start: 2021-08-17 — End: ?

## 2021-08-17 NOTE — Assessment & Plan Note (Signed)
Annual exam as documented.  ?Counseling done include healthy lifestyle involving committing to 150 minutes of exercise per week, heart healthy diet, and attaining healthy weight. The importance of adequate sleep also discussed.  ?Regular use of seat belt and home safety were also discussed . ?Changes in health habits are decided on by patient with goals and time frames set for achieving them. ?Immunization and cancer screening  needs are specifically addressed at this visit.   ?
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
## 2021-08-17 NOTE — Patient Instructions (Signed)
Please use debrox ear wax removal for your ear wax.   It is important that you exercise regularly at least 30 minutes 5 times a week.  Think about what you will eat, plan ahead. Choose " clean, green, fresh or frozen" over canned, processed or packaged foods which are more sugary, salty and fatty. 70 to 75% of food eaten should be vegetables and fruit. Three meals at set times with snacks allowed between meals, but they must be fruit or vegetables. Aim to eat over a 12 hour period , example 7 am to 7 pm, and STOP after  your last meal of the day. Drink water,generally about 64 ounces per day, no other drink is as healthy. Fruit juice is best enjoyed in a healthy way, by EATING the fruit.  Thanks for choosing [facility], we consider it a privelige to serve you.

## 2021-08-17 NOTE — Assessment & Plan Note (Signed)
rx debrox ear wax removal. Both ears flushed in the office today but there still remains some wax after cleaning.

## 2021-08-17 NOTE — Assessment & Plan Note (Signed)
Importance of healthy food choices with portion control discussed as well as eating regularly within 12  hour window.   The need to choose clean green food 50%-75% of time is discussed as well as make water the primary drink and set a goal for 64 ounces daily.  Patient reeducated about the importance of committment to minimum of 150 minutes of exercise per week.  Three meals at set times with snacks allowed between meals but they must be fruit or vegetable.   Aim to eat  over 12 hour period  for example 7 am to 7 pm. Stop after your last meal of the day.  Wt Readings from Last 3 Encounters:  08/17/21 213 lb (96.6 kg)  01/26/21 205 lb (93 kg)  08/15/20 208 lb (94.3 kg)

## 2021-08-18 ENCOUNTER — Encounter: Payer: Self-pay | Admitting: Nurse Practitioner

## 2021-08-18 DIAGNOSIS — Z23 Encounter for immunization: Secondary | ICD-10-CM | POA: Insufficient documentation

## 2021-08-18 NOTE — Assessment & Plan Note (Signed)
Patient educated on CDC recommendation for the vaccine. Verbal consent was obtained from the patient, vaccine administered by nurse, no sign of adverse reactions noted at this time. Patient education on arm soreness and use of tylenol or ibuprofen (if safe) for this patient  was discussed. Patient educated on the signs and symptoms of adverse effect and advise to contact the office if they occur °

## 2022-08-19 ENCOUNTER — Encounter: Payer: BC Managed Care – PPO | Admitting: Nurse Practitioner

## 2022-08-21 ENCOUNTER — Encounter: Payer: BC Managed Care – PPO | Admitting: Nurse Practitioner

## 2023-02-13 ENCOUNTER — Ambulatory Visit
Admission: EM | Admit: 2023-02-13 | Discharge: 2023-02-13 | Disposition: A | Discharge status: Home / Self Care | Payer: BC Managed Care – PPO | Attending: Family Medicine | Admitting: Family Medicine

## 2023-02-13 DIAGNOSIS — Z20822 Contact with and (suspected) exposure to covid-19: Secondary | ICD-10-CM | POA: Insufficient documentation

## 2023-02-13 DIAGNOSIS — J069 Acute upper respiratory infection, unspecified: Secondary | ICD-10-CM

## 2023-02-13 MED ORDER — PAXLOVID (300/100) 20 X 150 MG & 10 X 100MG PO TBPK: 3.0000 | ORAL_TABLET | Freq: Two times a day (BID) | ORAL | 0 refills | Status: AC

## 2023-02-13 MED ORDER — PROMETHAZINE-DM 6.25-15 MG/5ML PO SYRP: 5.0000 mL | ORAL_SOLUTION | Freq: Four times a day (QID) | ORAL | 0 refills | Status: AC | PRN

## 2023-02-13 NOTE — ED Triage Notes (Signed)
Pt reports he has a cough, congestion, and night sweats x 3 days.  Took mucinex and nasal spray    Dad tested positive for covid sister is sick but neg pos result
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
## 2023-02-14 LAB — SARS CORONAVIRUS 2 (TAT 6-24 HRS): SARS Coronavirus 2: POSITIVE — AB

## 2024-07-05 DIAGNOSIS — R059 Cough, unspecified: Secondary | ICD-10-CM | POA: Diagnosis not present

## 2024-07-05 DIAGNOSIS — R0981 Nasal congestion: Secondary | ICD-10-CM | POA: Diagnosis not present
